# Patient Record
Sex: Female | Born: 1970 | Race: Black or African American | Hispanic: No | Marital: Single | Smoking: Never smoker
Health system: Southern US, Community
[De-identification: ages and names within clinical notes are randomized; demographics above are authoritative.]

## PROBLEM LIST (undated history)

## (undated) DIAGNOSIS — I10 Essential (primary) hypertension: Secondary | ICD-10-CM

## (undated) DIAGNOSIS — E119 Type 2 diabetes mellitus without complications: Secondary | ICD-10-CM

## (undated) HISTORY — PX: LEEP: SHX91

---

## 2006-12-11 ENCOUNTER — Ambulatory Visit (HOSPITAL_COMMUNITY): Admission: RE | Admit: 2006-12-11 | Discharge: 2006-12-11 | Payer: Self-pay | Admitting: Family Medicine

## 2017-01-27 ENCOUNTER — Emergency Department (HOSPITAL_COMMUNITY)
Admission: EM | Admit: 2017-01-27 | Discharge: 2017-01-27 | Disposition: A | Discharge status: Home / Self Care | Payer: BLUE CROSS/BLUE SHIELD | Attending: Emergency Medicine | Admitting: Emergency Medicine

## 2017-01-27 ENCOUNTER — Encounter: Payer: Self-pay | Admitting: Emergency Medicine

## 2017-01-27 DIAGNOSIS — I10 Essential (primary) hypertension: Secondary | ICD-10-CM

## 2017-01-27 LAB — CBC
HCT: 39.5 % (ref 36.0–46.0)
Hemoglobin: 13.4 g/dL (ref 12.0–15.0)
MCH: 29.5 pg (ref 26.0–34.0)
MCHC: 33.9 g/dL (ref 30.0–36.0)
MCV: 86.8 fL (ref 78.0–100.0)
Platelets: 484 10*3/uL — ABNORMAL HIGH (ref 150–400)
RBC: 4.55 MIL/uL (ref 3.87–5.11)
RDW: 13.3 % (ref 11.5–15.5)
WBC: 8.3 10*3/uL (ref 4.0–10.5)

## 2017-01-27 LAB — BASIC METABOLIC PANEL
Anion gap: 11 (ref 5–15)
BUN: 10 mg/dL (ref 6–20)
CO2: 23 mmol/L (ref 22–32)
Calcium: 9.7 mg/dL (ref 8.9–10.3)
Chloride: 99 mmol/L — ABNORMAL LOW (ref 101–111)
Creatinine, Ser: 0.78 mg/dL (ref 0.44–1.00)
GFR calc Af Amer: >60 mL/min (ref >60)
GFR calc non Af Amer: >60 mL/min (ref >60)
Glucose, Bld: 93 mg/dL (ref 65–99)
Potassium: 3.6 mmol/L (ref 3.5–5.1)
Sodium: 133 mmol/L — ABNORMAL LOW (ref 135–145)

## 2017-01-27 LAB — TROPONIN I: Troponin I: <0.03 ng/mL (ref <0.03)

## 2017-01-27 MED ORDER — CLONIDINE HCL 0.1 MG PO TABS
ORAL_TABLET | ORAL | 0 refills | Status: AC
Start: 2017-01-27 — End: ?

## 2017-01-27 NOTE — ED Notes (Signed)
Called to recheck Pt's vitals. No Answer x's 3.

## 2017-01-27 NOTE — ED Provider Notes (Signed)
[facility]ENC[facility] Provider Note   CSN: [id number] Arrival date & time: 01/27/17  1244     History   Chief Complaint Chief Complaint  Patient presents with  . Hypertension    HPI [name] York is a 46 y.o. female.  Pt presents to the ED today with HTN.  [facility].  Her BP was over 160 and she was told to come to the emergency room for evaluation.  She has a hx of htn and she usually is given a clonidine which takes care of it.  She said they did not want her bp to be elevated over the weekend.  The pt denies any cp or h/a.  Since she's been here, her bp has come down to normal.      No past medical history on file.   HTN DM There are no active problems to display for this patient.     OB History    No data available       Home Medications    See pt's list Prior to Admission medications   Medication Sig Start Date End Date Taking? Authorizing Provider  cloNIDine (CATAPRES) 0.1 MG tablet Take one pill per day if SBP over 160 01/27/17   Jacalyn LefevreHaviland, Mert Dietrick, MD    Family History No family history on file.  Social History Social History   Tobacco Use  . Smoking status: Not on file  Substance Use Topics  . Alcohol use: Not on file  . Drug use: Not on file  no smoking.  No drugs.   Allergies   Patient has no known allergies.   Review of Systems Review of Systems  All other systems reviewed and are negative.    Physical Exam Updated Vital Signs BP 129/78 (BP Location: Left Arm)   Pulse 64   Temp 98.3 F (36.8 C) (Oral)   Resp 16   Ht 5\' 7"  (1.702 m)   Wt 133.8 kg (295 lb)   LMP 01/23/2017   SpO2 98%   BMI 46.20 kg/m   Physical Exam  Constitutional: She is oriented to person, place, and time. She appears well-developed and well-nourished.  HENT:  Head: Normocephalic and atraumatic.  Right Ear: External ear normal.  Left Ear: External ear normal.  Nose: Nose  normal.  Mouth/Throat: Oropharynx is clear and moist.  Eyes: Conjunctivae and EOM are normal. Pupils are equal, round, and reactive to light.  Neck: Normal range of motion. Neck supple.  Cardiovascular: Normal rate, regular rhythm, normal heart sounds and intact distal pulses.  Pulmonary/Chest: Effort normal and breath sounds normal.  Abdominal: Soft. Bowel sounds are normal.  Musculoskeletal: Normal range of motion.  Neurological: She is alert and oriented to person, place, and time.  Skin: Skin is warm and dry. Capillary refill takes less than 2 seconds.  Psychiatric: She has a normal mood and affect. Her behavior is normal. Judgment and thought content normal.  Nursing note and vitals reviewed.    ED Treatments / Results  Labs (all labs ordered are listed, but only abnormal results are displayed) Labs Reviewed  BASIC METABOLIC PANEL - Abnormal; Notable for the following components:      Result Value   Sodium 133 (*)    Chloride 99 (*)    All other components within normal limits  CBC - Abnormal; Notable for the following components:   Platelets 484 (*)    All other components  within normal limits  TROPONIN I    EKG  EKG Interpretation None       Radiology No results found.  Procedures Procedures (including critical care time)  Medications Ordered in ED Medications - No data to display   Initial Impression / Assessment and Plan / ED Course  I have reviewed the triage vital signs and the nursing notes.  Pertinent labs & imaging results that were available during my care of the patient were reviewed by me and considered in my medical decision making (see chart for details).  EKG from [facility].  Pt is given rx of clonidine to take prn.  Return if worse.  F/u with pcp.  Final Clinical Impressions(s) / ED Diagnoses   Final diagnoses:  Essential hypertension    ED Discharge Orders        Ordered    cloNIDine (CATAPRES) 0.1 MG tablet     01/27/17 1754        Jacalyn LefevreHaviland, Addisyn Leclaire, MD 01/27/17 1757

## 2017-01-27 NOTE — ED Triage Notes (Signed)
Pt arrives to ED after seeing her PCP at a [facility] in [place] and was told her BP was in 160s and should come to ED. Pt denies any symptoms but was feeling dizzy prior to going to clinic this morning.

## 2019-07-12 ENCOUNTER — Encounter (HOSPITAL_COMMUNITY): Payer: Self-pay | Admitting: *Deleted

## 2019-07-12 ENCOUNTER — Emergency Department (HOSPITAL_COMMUNITY)
Admission: EM | Admit: 2019-07-12 | Discharge: 2019-07-12 | Disposition: A | Discharge status: Home / Self Care | Payer: No Typology Code available for payment source | Attending: Emergency Medicine | Admitting: Emergency Medicine

## 2019-07-12 ENCOUNTER — Other Ambulatory Visit: Payer: Self-pay

## 2019-07-12 ENCOUNTER — Emergency Department (HOSPITAL_COMMUNITY): Payer: No Typology Code available for payment source

## 2019-07-12 DIAGNOSIS — I10 Essential (primary) hypertension: Secondary | ICD-10-CM | POA: Insufficient documentation

## 2019-07-12 DIAGNOSIS — Z7984 Long term (current) use of oral hypoglycemic drugs: Secondary | ICD-10-CM | POA: Diagnosis not present

## 2019-07-12 DIAGNOSIS — E119 Type 2 diabetes mellitus without complications: Secondary | ICD-10-CM | POA: Insufficient documentation

## 2019-07-12 DIAGNOSIS — R1013 Epigastric pain: Secondary | ICD-10-CM | POA: Insufficient documentation

## 2019-07-12 HISTORY — DX: Type 2 diabetes mellitus without complications: E11.9

## 2019-07-12 HISTORY — DX: Essential (primary) hypertension: I10

## 2019-07-12 LAB — CBC
HCT: 40.5 % (ref 36.0–46.0)
Hemoglobin: 13.6 g/dL (ref 12.0–15.0)
MCH: 30.3 pg (ref 26.0–34.0)
MCHC: 33.6 g/dL (ref 30.0–36.0)
MCV: 90.2 fL (ref 80.0–100.0)
Platelets: 414 10*3/uL — ABNORMAL HIGH (ref 150–400)
RBC: 4.49 MIL/uL (ref 3.87–5.11)
RDW: 12.7 % (ref 11.5–15.5)
WBC: 7.6 10*3/uL (ref 4.0–10.5)
nRBC: 0 % (ref 0.0–0.2)

## 2019-07-12 LAB — COMPREHENSIVE METABOLIC PANEL
ALT: 18 U/L (ref 0–44)
AST: 17 U/L (ref 15–41)
Albumin: 4.6 g/dL (ref 3.5–5.0)
Alkaline Phosphatase: 66 U/L (ref 38–126)
Anion gap: 12 (ref 5–15)
BUN: 14 mg/dL (ref 6–20)
CO2: 26 mmol/L (ref 22–32)
Calcium: 9.7 mg/dL (ref 8.9–10.3)
Chloride: 97 mmol/L — ABNORMAL LOW (ref 98–111)
Creatinine, Ser: 0.85 mg/dL (ref 0.44–1.00)
GFR calc Af Amer: >60 mL/min (ref >60)
GFR calc non Af Amer: >60 mL/min (ref >60)
Glucose, Bld: 196 mg/dL — ABNORMAL HIGH (ref 70–99)
Potassium: 4 mmol/L (ref 3.5–5.1)
Sodium: 135 mmol/L (ref 135–145)
Total Bilirubin: 0.3 mg/dL (ref 0.3–1.2)
Total Protein: 8.4 g/dL — ABNORMAL HIGH (ref 6.5–8.1)

## 2019-07-12 LAB — URINALYSIS, ROUTINE W REFLEX MICROSCOPIC
Bacteria, UA: NONE SEEN
Bilirubin Urine: NEGATIVE
Glucose, UA: >=500 mg/dL — AB
Hgb urine dipstick: NEGATIVE
Ketones, ur: NEGATIVE mg/dL
Leukocytes,Ua: NEGATIVE
Nitrite: NEGATIVE
Protein, ur: NEGATIVE mg/dL
Specific Gravity, Urine: 1.027 (ref 1.005–1.030)
pH: 5 (ref 5.0–8.0)

## 2019-07-12 LAB — LIPASE, BLOOD: Lipase: 27 U/L (ref 11–51)

## 2019-07-12 LAB — POC URINE PREG, ED
Preg Test, Ur: NEGATIVE
Preg Test, Ur: NEGATIVE

## 2019-07-12 MED ORDER — PANTOPRAZOLE SODIUM 40 MG IV SOLR
40.0000 mg | Freq: Once | INTRAVENOUS | Status: AC
  Administered 2019-07-12: 40 mg via INTRAVENOUS
  Filled 2019-07-12: qty 40

## 2019-07-12 MED ORDER — IOHEXOL 300 MG/ML  SOLN
100.0000 mL | Freq: Once | INTRAMUSCULAR | Status: AC | PRN
  Administered 2019-07-12: 100 mL via INTRAVENOUS

## 2019-07-12 MED ORDER — PANTOPRAZOLE SODIUM 40 MG PO TBEC: 40.0000 mg | DELAYED_RELEASE_TABLET | Freq: Every day | ORAL | 0 refills | Status: AC

## 2019-07-12 NOTE — ED Notes (Signed)
ED Provider at bedside. 

## 2019-07-12 NOTE — Discharge Instructions (Addendum)
You are scheduled to have an ultrasound on your abdomen tomorrow morning at 9 AM.  Please arrive 15 minutes early to register.  I have prescribed Protonix to take daily.  Continue bland diet.  Call the GI provider listed to arrange a follow-up appointment.  Return to the emergency department for any worsening symptoms such as fever greater than 101.5, persistent vomiting.

## 2019-07-12 NOTE — ED Triage Notes (Signed)
Abdominal pain intermittent for several weeks

## 2019-07-12 NOTE — ED Provider Notes (Signed)
[facility] EMERGENCY DEPARTMENT Provider Note   CSN: [id number] Arrival date & time: 07/12/19  1630     History Chief Complaint  Patient presents with  . Abdominal Pain    [name] York is a 49 y.o. female.  HPI      [name] York is a 49 y.o. female who presents to the Emergency Department complaining of waxing and waning upper abdominal pain for 2 weeks.  She reports bouts of severe pain along her epigastric area that wanes to a dull pain but does not resolve.  Symptoms are also associated with some upper back pain.  Pain seems to worsen with food intake.  She also reports decreased appetite.  No nausea or vomiting.  No diarrhea, chest pain or shortness of breath.  She denies fever or chills.  No history of abdominal surgeries.  She has tried antacids without relief.   Past Medical History:  Diagnosis Date  . Diabetes mellitus without complication (HCC)   . Hypertension     There are no problems to display for this patient.   History reviewed. No pertinent surgical history.   OB History   No obstetric history on file.     No family history on file.  Social History   Tobacco Use  . Smoking status: Never Smoker  . Smokeless tobacco: Never Used  Vaping Use  . Vaping Use: Never used  Substance Use Topics  . Alcohol use: Never  . Drug use: Never    Home Medications Prior to Admission medications   Medication Sig Start Date End Date Taking? Authorizing Provider  cloNIDine (CATAPRES) 0.1 MG tablet Take one pill per day if SBP over 160 01/27/17   Jacalyn Lefevre, MD    Allergies    Patient has no known allergies.  Review of Systems   Review of Systems  Constitutional: Negative for appetite change, chills and fever.  Respiratory: Negative for shortness of breath.   Cardiovascular: Negative for chest pain.  Gastrointestinal: Positive for abdominal pain. Negative for abdominal distention, blood in stool, diarrhea, nausea and vomiting.   Genitourinary: Negative for decreased urine volume, difficulty urinating, dysuria and flank pain.  Musculoskeletal: Negative for back pain (Upper mid to right back pain).  Skin: Negative for color change and rash.  Neurological: Negative for dizziness, weakness and numbness.  Hematological: Negative for adenopathy.    Physical Exam Updated Vital Signs BP (!) 157/88   Pulse 79   Temp 98.6 F (37 C)   Resp 20   LMP 06/23/2019   SpO2 99%   Physical Exam Vitals and nursing note reviewed.  Constitutional:      General: She is not in acute distress.    Appearance: Normal appearance. She is well-developed.  HENT:     Mouth/Throat:     Mouth: Mucous membranes are moist.  Cardiovascular:     Rate and Rhythm: Normal rate and regular rhythm.     Pulses: Normal pulses.  Pulmonary:     Effort: Pulmonary effort is normal.     Breath sounds: Normal breath sounds.  Abdominal:     General: There is no distension.     Palpations: Abdomen is soft.     Tenderness: There is abdominal tenderness. There is no right CVA tenderness, left CVA tenderness or guarding.     Comments: Tenderness palpation of the epigastric area.  No guarding or rebound.  Abdomen is soft.  Musculoskeletal:        General: Normal range of motion.  Skin:    General: Skin is warm.     Capillary Refill: Capillary refill takes less than 2 seconds.     Findings: No rash.  Neurological:     General: No focal deficit present.     Mental Status: She is alert.     ED Results / Procedures / Treatments   Labs (all labs ordered are listed, but only abnormal results are displayed) Labs Reviewed  CBC - Abnormal; Notable for the following components:      Result Value   Platelets 414 (*)    All other components within normal limits  LIPASE, BLOOD  COMPREHENSIVE METABOLIC PANEL  URINALYSIS, ROUTINE W REFLEX MICROSCOPIC  POC URINE PREG, ED    EKG None  Radiology No results found.  Procedures Procedures  (including critical care time)  Medications Ordered in ED Medications  pantoprazole (PROTONIX) injection 40 mg (has no administration in time range)    ED Course  I have reviewed the triage vital signs and the nursing notes.  Pertinent labs & imaging results that were available during my care of the patient were reviewed by me and considered in my medical decision making (see chart for details).   MDM Rules/Calculators/A&P                          Patient here with waxing and waning 2-week history of upper abdominal pain with pain worsening upon food intake.  On exam she is well-appearing, nontoxic.  She does have some mild to moderate right upper quadrant and epigastric pain.  No peritoneal signs.  Exam concerning for gallbladder disease.  Ultrasound not available after hours, will proceed with laboratory studies and CT of the abdomen and pelvis.   CT A/P shows duodenitis with possible duodenal ulcer.  Appendix unremarkable and gallbladder is decompressed.  I will start pt on PPI and schedule her to return here for out patient [place] of RUQ for Saturday morning.  Pt agrees to plan.  F/u info given for GI as well.    Final Clinical Impression(s) / ED Diagnoses Final diagnoses:  Epigastric pain    Rx / DC Orders ED Discharge Orders    None       Kem Parkinson, PA-C 07/14/19 1344    Fredia Sorrow, MD 07/22/19 1701

## 2019-07-13 ENCOUNTER — Ambulatory Visit (HOSPITAL_COMMUNITY)
Admission: RE | Admit: 2019-07-13 | Discharge: 2019-07-13 | Disposition: A | Discharge status: Home / Self Care | Payer: No Typology Code available for payment source | Source: Ambulatory Visit | Attending: Emergency Medicine | Admitting: Emergency Medicine

## 2019-07-13 DIAGNOSIS — R1013 Epigastric pain: Secondary | ICD-10-CM | POA: Insufficient documentation

## 2019-07-13 NOTE — ED Provider Notes (Signed)
The patient return for her ultrasound this morning, it is negative for acute findings, she was reassured that her CT scan showed some duodenal bulb inflammation and that she should continue with her proton pump inhibitor, also recommended Pepcid, patient agreeable, stable for discharge   Eber Hong, MD 07/13/19 1054

## 2020-02-15 ENCOUNTER — Other Ambulatory Visit: Payer: No Typology Code available for payment source

## 2020-02-15 DIAGNOSIS — Z20822 Contact with and (suspected) exposure to covid-19: Secondary | ICD-10-CM

## 2020-02-18 LAB — NOVEL CORONAVIRUS, NAA: SARS-CoV-2, NAA: DETECTED — AB

## 2021-02-16 ENCOUNTER — Emergency Department (HOSPITAL_COMMUNITY): Payer: No Typology Code available for payment source

## 2021-02-16 ENCOUNTER — Other Ambulatory Visit: Payer: Self-pay

## 2021-02-16 ENCOUNTER — Emergency Department (HOSPITAL_COMMUNITY)
Admission: EM | Admit: 2021-02-16 | Discharge: 2021-02-16 | Disposition: A | Discharge status: Home / Self Care | Payer: No Typology Code available for payment source | Attending: Emergency Medicine | Admitting: Emergency Medicine

## 2021-02-16 ENCOUNTER — Encounter (HOSPITAL_COMMUNITY): Payer: Self-pay

## 2021-02-16 DIAGNOSIS — M5030 Other cervical disc degeneration, unspecified cervical region: Secondary | ICD-10-CM | POA: Insufficient documentation

## 2021-02-16 DIAGNOSIS — R739 Hyperglycemia, unspecified: Secondary | ICD-10-CM

## 2021-02-16 DIAGNOSIS — E1165 Type 2 diabetes mellitus with hyperglycemia: Secondary | ICD-10-CM | POA: Insufficient documentation

## 2021-02-16 DIAGNOSIS — Z79899 Other long term (current) drug therapy: Secondary | ICD-10-CM | POA: Insufficient documentation

## 2021-02-16 DIAGNOSIS — I10 Essential (primary) hypertension: Secondary | ICD-10-CM | POA: Insufficient documentation

## 2021-02-16 DIAGNOSIS — R519 Headache, unspecified: Secondary | ICD-10-CM | POA: Diagnosis present

## 2021-02-16 DIAGNOSIS — Z7984 Long term (current) use of oral hypoglycemic drugs: Secondary | ICD-10-CM | POA: Diagnosis not present

## 2021-02-16 LAB — CBC WITH DIFFERENTIAL/PLATELET
Abs Immature Granulocytes: 0.02 10*3/uL (ref 0.00–0.07)
Basophils Absolute: 0.1 10*3/uL (ref 0.0–0.1)
Basophils Relative: 1 %
Eosinophils Absolute: 0.3 10*3/uL (ref 0.0–0.5)
Eosinophils Relative: 3 %
HCT: 42.3 % (ref 36.0–46.0)
Hemoglobin: 15 g/dL (ref 12.0–15.0)
Immature Granulocytes: 0 %
Lymphocytes Relative: 44 %
Lymphs Abs: 3.2 10*3/uL (ref 0.7–4.0)
MCH: 32.4 pg (ref 26.0–34.0)
MCHC: 35.5 g/dL (ref 30.0–36.0)
MCV: 91.4 fL (ref 80.0–100.0)
Monocytes Absolute: 0.5 10*3/uL (ref 0.1–1.0)
Monocytes Relative: 6 %
Neutro Abs: 3.4 10*3/uL (ref 1.7–7.7)
Neutrophils Relative %: 46 %
Platelets: 363 10*3/uL (ref 150–400)
RBC: 4.63 MIL/uL (ref 3.87–5.11)
RDW: 12.4 % (ref 11.5–15.5)
WBC: 7.4 10*3/uL (ref 4.0–10.5)
nRBC: 0 % (ref 0.0–0.2)

## 2021-02-16 LAB — BASIC METABOLIC PANEL
Anion gap: 12 (ref 5–15)
BUN: 12 mg/dL (ref 6–20)
CO2: 23 mmol/L (ref 22–32)
Calcium: 9.3 mg/dL (ref 8.9–10.3)
Chloride: 98 mmol/L (ref 98–111)
Creatinine, Ser: 0.68 mg/dL (ref 0.44–1.00)
GFR, Estimated: >60 mL/min (ref >60)
Glucose, Bld: 368 mg/dL — ABNORMAL HIGH (ref 70–99)
Potassium: 4.1 mmol/L (ref 3.5–5.1)
Sodium: 133 mmol/L — ABNORMAL LOW (ref 135–145)

## 2021-02-16 LAB — CBG MONITORING, ED: Glucose-Capillary: 301 mg/dL — ABNORMAL HIGH (ref 70–99)

## 2021-02-16 LAB — HCG, QUANTITATIVE, PREGNANCY: hCG, Beta Chain, Quant, S: <1 m[IU]/mL (ref <5)

## 2021-02-16 MED ORDER — METOPROLOL SUCCINATE ER 50 MG PO TB24
100.0000 mg | ORAL_TABLET | Freq: Every day | ORAL | Status: DC
  Administered 2021-02-16: 100 mg via ORAL
  Filled 2021-02-16: qty 2

## 2021-02-16 MED ORDER — DIPHENHYDRAMINE HCL 50 MG/ML IJ SOLN
12.5000 mg | Freq: Once | INTRAMUSCULAR | Status: AC
  Administered 2021-02-16: 12.5 mg via INTRAVENOUS
  Filled 2021-02-16: qty 1

## 2021-02-16 MED ORDER — LOSARTAN POTASSIUM 25 MG PO TABS
100.0000 mg | ORAL_TABLET | Freq: Once | ORAL | Status: AC
Start: 2021-02-16 — End: 2021-02-16
  Administered 2021-02-16: 100 mg via ORAL
  Filled 2021-02-16: qty 4

## 2021-02-16 MED ORDER — SODIUM CHLORIDE 0.9 % IV BOLUS
1000.0000 mL | Freq: Once | INTRAVENOUS | Status: AC
  Administered 2021-02-16: 1000 mL via INTRAVENOUS

## 2021-02-16 MED ORDER — AMLODIPINE BESYLATE 5 MG PO TABS
10.0000 mg | ORAL_TABLET | Freq: Once | ORAL | Status: AC
  Administered 2021-02-16: 10 mg via ORAL
  Filled 2021-02-16: qty 2

## 2021-02-16 MED ORDER — ACETAMINOPHEN 500 MG PO TABS
1000.0000 mg | ORAL_TABLET | Freq: Once | ORAL | Status: AC
  Administered 2021-02-16: 1000 mg via ORAL
  Filled 2021-02-16: qty 2

## 2021-02-16 MED ORDER — GADOBUTROL 1 MMOL/ML IV SOLN
10.0000 mL | Freq: Once | INTRAVENOUS | Status: AC | PRN
  Administered 2021-02-16: 10 mL via INTRAVENOUS

## 2021-02-16 MED ORDER — INSULIN ASPART 100 UNIT/ML IJ SOLN
8.0000 [IU] | Freq: Once | INTRAMUSCULAR | Status: AC
  Administered 2021-02-16: 8 [IU] via SUBCUTANEOUS
  Filled 2021-02-16: qty 1

## 2021-02-16 MED ORDER — PROCHLORPERAZINE EDISYLATE 10 MG/2ML IJ SOLN
10.0000 mg | Freq: Once | INTRAMUSCULAR | Status: AC
Start: 2021-02-16 — End: 2021-02-16
  Administered 2021-02-16: 10 mg via INTRAVENOUS
  Filled 2021-02-16: qty 2

## 2021-02-16 MED ORDER — METOPROLOL TARTRATE 5 MG/5ML IV SOLN: 5.0000 mg | Freq: Once | INTRAVENOUS | Status: DC

## 2021-02-16 NOTE — ED Provider Notes (Signed)
°  Face-to-face evaluation   History: She presents for evaluation of headache for several days, followed by numbness in right forearm and hand, which started today.  She did not take her blood pressure medications prior to arrival today.  She does not have chronic headaches, neck pain, dizziness, numbness or weakness.  Physical exam: Alert middle-aged female who is overweight.  No dysarthria or aphasia.  No facial asymmetry.  Normal motion arms and legs bilaterally  MDM: Evaluation for  Chief Complaint  Patient presents with   Headache     Unusual constellation of headache, and right forearm and hand numbness, without chronic neck pain.  Differential diagnosis includes multiple sclerosis, CVA, and metabolic disorders.  Initial head CT is negative so MRI imaging of the brain and cervical spine have been ordered.  Medical screening examination/treatment/procedure(s) were conducted as a shared visit with non-physician practitioner(s) and myself.  I personally evaluated the patient during the encounter    Mancel Bale, MD 02/16/21 1956

## 2021-02-16 NOTE — Discharge Instructions (Addendum)
Make sure you are taking your blood pressure medications as prescribed to avoid further problems with high blood pressure.  Also, your blood glucose level is elevated today, it is improved currently but still needs some work, take your home medications and watch your diet closely.  As discussed if you develop any weakness (not tingling) in your arm or hand you need to emergently get rechecked for this worsening symptom.

## 2021-02-16 NOTE — ED Provider Notes (Signed)
[facility] EMERGENCY DEPARTMENT Provider Note   CSN: [id number] Arrival date & time: 02/16/21  0744     History  Chief Complaint  Patient presents with   Headache    [name] York is a 51 y.o. female with history significant for hypertension and diabetes presenting for evaluation of a headache which is been present for the past 4 days, stating it started out predominantly left-sided behind her left eye but has now spread and includes the entire head.  In association she reports tingling sensation in her right forearm which she woke with this morning.  She has been compliant with her blood pressure medicines which include metoprolol, amlodipine and spironolactone however she did not take her blood pressure medicine this morning.  She denies nausea or vomiting, denies focal weakness, dizziness, neck pain or stiffness.  She does endorse mild blurred vision currently.  She wears glasses and is currently wearing them.  She also denies chest pain or shortness of breath.  She has been compliant with her diabetes medications, but states she received a Decadron injection last week secondary to suspected insect bites which have been itchy, hence elevation in her blood glucose levels.  She has found no alleviators for her headache pain.  The history is provided by the patient.      Home Medications Prior to Admission medications   Medication Sig Start Date End Date Taking? Authorizing Provider  amLODipine (NORVASC) 10 MG tablet Take 1 tablet by mouth daily. 02/06/18  Yes [provider]  DULoxetine (CYMBALTA) 30 MG capsule Take 30 mg by mouth 2 (two) times daily.   Yes [provider]  gabapentin (NEURONTIN) 100 MG capsule Take 100-200 mg by mouth 2 (two) times daily. 170m in the morning and 2059mat night.   Yes [provider]  losartan (COZAAR) 100 MG tablet Take 1 tablet by mouth daily. 03/29/17  Yes [provider]  metFORMIN (GLUCOPHAGE) 1000 MG tablet  Take 1 tablet by mouth 2 (two) times daily.   Yes [provider]  metoprolol succinate (TOPROL-XL) 100 MG 24 hr tablet Take 100 mg by mouth 2 (two) times daily. 02/06/18  Yes [provider]  spironolactone (ALDACTONE) 25 MG tablet Take 1 tablet by mouth daily.   Yes [provider]  cloNIDine (CATAPRES) 0.1 MG tablet Take one pill per day if SBP over 160 Patient not taking: Reported on 02/16/2021 01/27/17   HaIsla PenceMD  pantoprazole (PROTONIX) 40 MG tablet Take 1 tablet (40 mg total) by mouth daily. Patient not taking: Reported on 02/16/2021 07/12/19   TrKem ParkinsonPA-C      Allergies    Sulfa antibiotics    Review of Systems   Review of Systems  Constitutional:  Negative for chills and fever.  HENT:  Negative for congestion and sore throat.   Eyes:  Positive for visual disturbance. Negative for photophobia.  Respiratory:  Negative for chest tightness and shortness of breath.   Cardiovascular:  Negative for chest pain.  Gastrointestinal:  Negative for abdominal pain, nausea and vomiting.  Genitourinary: Negative.   Musculoskeletal:  Negative for arthralgias, joint swelling, neck pain and neck stiffness.  Skin: Negative.  Negative for rash and wound.  Neurological:  Positive for numbness and headaches. Negative for dizziness, weakness and light-headedness.  Psychiatric/Behavioral: Negative.     Physical Exam Updated Vital Signs BP 129/79 (BP Location: Right Arm)    Pulse 78    Temp 98.7 F (37.1 C) (Oral)  Resp 16    Ht _0  (1.702 m)    Wt 128.4 kg    LMP 01/17/2021 (Approximate)    SpO2 98%    BMI 44.32 kg/m  Physical Exam Vitals and nursing note reviewed.  Constitutional:      Appearance: She is well-developed.  HENT:     Head: Normocephalic and atraumatic.     Right Ear: Tympanic membrane normal.     Left Ear: Tympanic membrane normal.  Eyes:     Extraocular Movements: Extraocular movements intact.     Conjunctiva/sclera: Conjunctivae  normal.     Pupils: Pupils are equal, round, and reactive to light.  Cardiovascular:     Rate and Rhythm: Normal rate and regular rhythm.     Pulses:          Radial pulses are 2+ on the right side and 2+ on the left side.     Heart sounds: Normal heart sounds.  Pulmonary:     Effort: Pulmonary effort is normal.     Breath sounds: Normal breath sounds. No wheezing.  Abdominal:     General: Bowel sounds are normal.     Palpations: Abdomen is soft.     Tenderness: There is no abdominal tenderness.  Musculoskeletal:        General: Normal range of motion.     Cervical back: Normal range of motion and neck supple.  Lymphadenopathy:     Cervical: No cervical adenopathy.  Skin:    General: Skin is warm and dry.     Findings: No rash.  Neurological:     General: No focal deficit present.     Mental Status: She is alert and oriented to person, place, and time.     GCS: GCS eye subscore is 4. GCS verbal subscore is 5. GCS motor subscore is 6.     Cranial Nerves: No cranial nerve deficit, dysarthria or facial asymmetry.     Sensory: Sensory deficit present.     Coordination: Coordination normal.     Gait: Gait normal.     Deep Tendon Reflexes: Reflexes normal.     Comments: Normal heel-shin, normal rapid alternating movements. Cranial nerves III-XII intact.  No pronator drift.  Decreased sensation to fine touch right dorsal forearm.  Sensation in hand and fingertips is normal, no vulvar involvement.  Psychiatric:        Speech: Speech normal.        Behavior: Behavior normal.        Thought Content: Thought content normal.    ED Results / Procedures / Treatments   Labs (all labs ordered are listed, but only abnormal results are displayed) Labs Reviewed  BASIC METABOLIC PANEL - Abnormal; Notable for the following components:      Result Value   Sodium 133 (*)    Glucose, Bld 368 (*)    All other components within normal limits  CBG MONITORING, ED - Abnormal; Notable for the  following components:   Glucose-Capillary 301 (*)    All other components within normal limits  CBC WITH DIFFERENTIAL/PLATELET  HCG, QUANTITATIVE, PREGNANCY    EKG EKG Interpretation  Date/Time:  Tuesday February 16 2021 10:33:55 EST Ventricular Rate:  79 PR Interval:  146 QRS Duration: 80 QT Interval:  416 QTC Calculation: 477 R Axis:   30 Text Interpretation: Sinus rhythm with Premature atrial complexes with Abberant conduction Otherwise normal ECG No previous ECGs available Confirmed by Daleen Bo [phone]) on 02/16/2021 11:24:47 AM  Radiology CT  Head Wo Contrast  Result Date: 02/16/2021 CLINICAL DATA:  Patient c/o headache with n/v todayHeadache, new or worsening (Age >= 50y) headache with hypertension, right hand tingling EXAM: CT HEAD WITHOUT CONTRAST TECHNIQUE: Contiguous axial images were obtained from the base of the skull through the vertex without intravenous contrast. RADIATION DOSE REDUCTION: This exam was performed according to the departmental dose-optimization program which includes automated exposure control, adjustment of the mA and/or kV according to patient size and/or use of iterative reconstruction technique. COMPARISON:  None. FINDINGS: Brain: No acute intracranial hemorrhage. No focal mass lesion. No CT evidence of acute infarction. No midline shift or mass effect. No hydrocephalus. Basilar cisterns are patent. Vascular: No hyperdense vessel or unexpected calcification. Skull: Normal. Negative for fracture or focal lesion. Sinuses/Orbits: Paranasal sinuses and mastoid air cells are clear. Orbits are clear. Other: None. IMPRESSION: Normal head CT. Electronically Signed   By: Suzy Bouchard M.D.   On: 02/16/2021 09:48   MR Brain W and Wo Contrast  Result Date: 02/16/2021 CLINICAL DATA:  Neuro deficit, acute, stroke suspected EXAM: MRI HEAD WITHOUT AND WITH CONTRAST TECHNIQUE: Multiplanar, multiecho pulse sequences of the brain and surrounding structures were obtained  without and with intravenous contrast. CONTRAST:  74mL GADAVIST GADOBUTROL 1 MMOL/ML IV SOLN COMPARISON:  None. FINDINGS: Brain: There is no acute infarction or intracranial hemorrhage. There is no intracranial mass, mass effect, or edema. There is no hydrocephalus or extra-axial fluid collection. Patchy foci of T2 hyperintensity in the supratentorial and pontine white matter nonspecific but may reflect mild chronic microvascular ischemic changes. Ventricles and sulci are normal in size and configuration. 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 Vascular: Major vessel flow voids at the skull base are preserved. Skull and upper cervical spine: Normal marrow signal is preserved. Sinuses/Orbits: Paranasal sinuses are aerated. Orbits are unremarkable. Other: Sella is unremarkable.  Mastoid air cells are clear. IMPRESSION: No acute infarction, hemorrhage, or mass. Mild chronic microvascular ischemic changes. Electronically Signed   By: Macy Mis M.D.   On: 02/16/2021 15:30   MR Cervical Spine W or Wo Contrast  Result Date: 02/16/2021 CLINICAL DATA:  Cervical radiculopathy EXAM: MRI CERVICAL SPINE WITHOUT AND WITH CONTRAST TECHNIQUE: Multiplanar and multiecho pulse sequences of the cervical spine, to include the craniocervical junction and cervicothoracic junction, were obtained without and with intravenous contrast. CONTRAST:  50mL GADAVIST GADOBUTROL 1 MMOL/ML IV SOLN COMPARISON:  None. FINDINGS: Alignment: Straightening of the cervical spine with mild reversal of the normal lordosis. Vertebrae: No fracture, evidence of discitis, or bone lesion. Cord: Normal signal and morphology. Posterior Fossa, vertebral arteries, paraspinal tissues: Negative. Disc levels: C2-3: No significant disc bulge. No neural foraminal stenosis. No central canal stenosis. C3-4: No significant disc bulge. No neural foraminal stenosis. No central canal stenosis. C4-5: No significant disc bulge. No neural foraminal  stenosis. No central canal stenosis. C5-6: Circumferential disc protrusion with flattening of the thecal sac. Mild bilateral neural foraminal narrowing, right worse than the left. C6-7: No significant disc bulge. No neural foraminal stenosis. No central canal stenosis. C7-T1: No significant disc bulge. No neural foraminal stenosis. No central canal stenosis. IMPRESSION: 1.  No acute fracture or subluxation. 2.  Visualized cord is normal in signal and morphology. 3. Degenerate disc disease prominent at C5-C6 with bilateral neural foraminal narrowing, right worse than the left. Electronically Signed   By: Keane Police D.O.   On: 02/16/2021 15:40    Procedures Procedures    Medications Ordered in ED Medications  metoprolol  succinate (TOPROL-XL) 24 hr tablet 100 mg (100 mg Oral Given 02/16/21 1134)  sodium chloride 0.9 % bolus 1,000 mL (1,000 mLs Intravenous New Bag/Given 02/16/21 0949)  insulin aspart (novoLOG) injection 8 Units (8 Units Subcutaneous Given 02/16/21 0949)  acetaminophen (TYLENOL) tablet 1,000 mg (1,000 mg Oral Given 02/16/21 0958)  losartan (COZAAR) tablet 100 mg (100 mg Oral Given 02/16/21 1133)  amLODipine (NORVASC) tablet 10 mg (10 mg Oral Given 02/16/21 1133)  prochlorperazine (COMPAZINE) injection 10 mg (10 mg Intravenous Given 02/16/21 1240)  diphenhydrAMINE (BENADRYL) injection 12.5 mg (12.5 mg Intravenous Given 02/16/21 1240)  gadobutrol (GADAVIST) 1 MMOL/ML injection 10 mL (10 mLs Intravenous Contrast Given 02/16/21 1432)    ED Course/ Medical Decision Making/ A&P                           Medical Decision Making Patient presenting with a left-sided headache which has been present x4 days, today developed numbness in her right forearm, presenting with hypertension and medication noncompliance.  Initially her CBG is very elevated, she has recently received a Decadron injection for an improving rash.  Differential diagnosis including acute CVA, ischemic versus hemorrhagic,  hypertensive emergency/urgency, metabolic etiology, MS, brain mass.   Amount and/or Complexity of Data Reviewed Labs: ordered.    Details: Labs including be met, hCG, CBC obtained.  Initially she had a blood glucose level at 368.  No DKA.  Other electrolytes are relatively normal, mild hyponatremia at 133 which is negligible. Radiology: ordered.    Details: CT imaging initially obtained to rule out acute hemorrhagic stroke, this was a normal study.  This was followed by an MRI of her head and C-spine to rule out ischemic findings, mets, mass or lesions suggesting MS.  This was reviewed and also negative for acute disease. ECG/medicine tests: ordered. Decision-making details documented in ED Course.  Risk OTC drugs.   Patient is stable for discharge home.  She was advised to follow-up with her PCP who is with the [facility] in [place].  She states she has an appointment with this provider on February 2.  She was advised to follow-up sooner for any return or worsening symptoms.  We also discussed referral to neurosurgery given her degenerative disc disease found on today's MR C-spine.  She will discuss this with her provider, however she may end up deciding to see a private neurosurgeon.  She was given a referral, Dr. Trenton Gammon if she chooses to go this route.  She has no emergent symptoms today suggesting need for surgical intervention at this time, however we did discuss strict return precautions.  Also advised close watch on her blood glucose levels over the next several days, make good food choices.  She is not chronically on steroids but did receive a Decadron injection 4 days ago.        Final Clinical Impression(s) / ED Diagnoses Final diagnoses:  Acute nonintractable headache, unspecified headache type  Primary hypertension  Hyperglycemia  DDD (degenerative disc disease), cervical    Rx / DC Orders ED Discharge Orders     None         Landis Martins 02/16/21 1718    Daleen Bo, MD 02/16/21 [phone]

## 2021-02-16 NOTE — ED Triage Notes (Signed)
Patient complaining of increased hypertension with headache and right hand tingling. The headache started on Saturday, has not taken blood pressure medication today. Right hand tingling was present when she woke up this morning.

## 2022-01-26 ENCOUNTER — Emergency Department (HOSPITAL_COMMUNITY): Payer: No Typology Code available for payment source

## 2022-01-26 ENCOUNTER — Other Ambulatory Visit: Payer: Self-pay

## 2022-01-26 ENCOUNTER — Emergency Department (HOSPITAL_COMMUNITY)
Admission: EM | Admit: 2022-01-26 | Discharge: 2022-01-26 | Disposition: A | Discharge status: Home / Self Care | Payer: No Typology Code available for payment source | Attending: Emergency Medicine | Admitting: Emergency Medicine

## 2022-01-26 ENCOUNTER — Encounter (HOSPITAL_COMMUNITY): Payer: Self-pay | Admitting: *Deleted

## 2022-01-26 DIAGNOSIS — D72829 Elevated white blood cell count, unspecified: Secondary | ICD-10-CM | POA: Insufficient documentation

## 2022-01-26 DIAGNOSIS — N3001 Acute cystitis with hematuria: Secondary | ICD-10-CM | POA: Insufficient documentation

## 2022-01-26 DIAGNOSIS — R109 Unspecified abdominal pain: Secondary | ICD-10-CM | POA: Diagnosis present

## 2022-01-26 DIAGNOSIS — R7309 Other abnormal glucose: Secondary | ICD-10-CM | POA: Diagnosis not present

## 2022-01-26 LAB — LIPASE, BLOOD: Lipase: 31 U/L (ref 11–51)

## 2022-01-26 LAB — URINALYSIS, ROUTINE W REFLEX MICROSCOPIC
Bilirubin Urine: NEGATIVE
Glucose, UA: >=500 mg/dL — AB
Ketones, ur: NEGATIVE mg/dL
Nitrite: POSITIVE — AB
Protein, ur: >=300 mg/dL — AB
RBC / HPF: >50 RBC/hpf — ABNORMAL HIGH (ref 0–5)
Specific Gravity, Urine: 1.017 (ref 1.005–1.030)
WBC, UA: >50 WBC/hpf — ABNORMAL HIGH (ref 0–5)
pH: 5 (ref 5.0–8.0)

## 2022-01-26 LAB — CBG MONITORING, ED: Glucose-Capillary: 178 mg/dL — ABNORMAL HIGH (ref 70–99)

## 2022-01-26 LAB — COMPREHENSIVE METABOLIC PANEL
ALT: 22 U/L (ref 0–44)
AST: 17 U/L (ref 15–41)
Albumin: 4.2 g/dL (ref 3.5–5.0)
Alkaline Phosphatase: 68 U/L (ref 38–126)
Anion gap: 10 (ref 5–15)
BUN: 15 mg/dL (ref 6–20)
CO2: 26 mmol/L (ref 22–32)
Calcium: 10.1 mg/dL (ref 8.9–10.3)
Chloride: 100 mmol/L (ref 98–111)
Creatinine, Ser: 0.88 mg/dL (ref 0.44–1.00)
GFR, Estimated: >60 mL/min (ref >60)
Glucose, Bld: 185 mg/dL — ABNORMAL HIGH (ref 70–99)
Potassium: 4.8 mmol/L (ref 3.5–5.1)
Sodium: 136 mmol/L (ref 135–145)
Total Bilirubin: 0.9 mg/dL (ref 0.3–1.2)
Total Protein: 7.8 g/dL (ref 6.5–8.1)

## 2022-01-26 LAB — CBC
HCT: 38.7 % (ref 36.0–46.0)
Hemoglobin: 13.3 g/dL (ref 12.0–15.0)
MCH: 31.1 pg (ref 26.0–34.0)
MCHC: 34.4 g/dL (ref 30.0–36.0)
MCV: 90.4 fL (ref 80.0–100.0)
Platelets: 427 10*3/uL — ABNORMAL HIGH (ref 150–400)
RBC: 4.28 MIL/uL (ref 3.87–5.11)
RDW: 12.3 % (ref 11.5–15.5)
WBC: 12.6 10*3/uL — ABNORMAL HIGH (ref 4.0–10.5)
nRBC: 0 % (ref 0.0–0.2)

## 2022-01-26 LAB — PREGNANCY, URINE: Preg Test, Ur: NEGATIVE

## 2022-01-26 MED ORDER — PHENAZOPYRIDINE HCL 95 MG PO TABS: 95.0000 mg | ORAL_TABLET | Freq: Three times a day (TID) | ORAL | 0 refills | Status: AC | PRN

## 2022-01-26 MED ORDER — NAPROXEN 500 MG PO TABS: 500.0000 mg | ORAL_TABLET | Freq: Two times a day (BID) | ORAL | 0 refills | Status: AC

## 2022-01-26 MED ORDER — ONDANSETRON HCL 4 MG PO TABS: 4.0000 mg | ORAL_TABLET | Freq: Four times a day (QID) | ORAL | 0 refills | Status: AC

## 2022-01-26 MED ORDER — CEPHALEXIN 500 MG PO CAPS: 500.0000 mg | ORAL_CAPSULE | Freq: Two times a day (BID) | ORAL | 0 refills | Status: AC

## 2022-01-26 MED ORDER — IOHEXOL 300 MG/ML  SOLN
100.0000 mL | Freq: Once | INTRAMUSCULAR | Status: AC | PRN
  Administered 2022-01-26: 100 mL via INTRAVENOUS

## 2022-01-26 MED ORDER — HYDROCODONE-ACETAMINOPHEN 5-325 MG PO TABS
1.0000 | ORAL_TABLET | Freq: Once | ORAL | Status: AC
  Administered 2022-01-26: 1 via ORAL
  Filled 2022-01-26: qty 1

## 2022-01-26 MED ORDER — KETOROLAC TROMETHAMINE 15 MG/ML IJ SOLN
15.0000 mg | Freq: Once | INTRAMUSCULAR | Status: AC
Start: 2022-01-26 — End: 2022-01-26
  Administered 2022-01-26: 15 mg via INTRAVENOUS
  Filled 2022-01-26: qty 1

## 2022-01-26 MED ORDER — ONDANSETRON 4 MG PO TBDP
4.0000 mg | ORAL_TABLET | Freq: Once | ORAL | Status: AC
  Administered 2022-01-26: 4 mg via ORAL
  Filled 2022-01-26: qty 1

## 2022-01-26 NOTE — ED Triage Notes (Signed)
Pt c/o right flank pain that radiates around to her abdomen;  Pt having episodes of diaphoresis and chills  Pt denies any urinary sx  Pt having nausea

## 2022-01-26 NOTE — Discharge Instructions (Signed)
It was a pleasure taking care of you today!   Your labs show slightly elevated white blood cell count however were otherwise without emergent findings.  Your CT scan did not show any emergent findings today. Your urine was notable for a UTI. You will be sent a prescription for Keflex, take as prescribed. Ensure that you complete the entire course of antibiotic.  You will also be sent a prescription for Naprosyn, Zofran, Pyridium.  Your urine will turn neon orange/yellow with the Pyridium.  Ensure to maintain fluid intake.  You may follow-up with your primary care provider as needed.  Return to the emergency department if you are experiencing increasing/worsening symptoms.

## 2022-01-26 NOTE — ED Notes (Signed)
Pt taken to ct 

## 2022-01-26 NOTE — ED Notes (Signed)
Patient transported to CT 

## 2022-01-26 NOTE — ED Provider Notes (Signed)
[facility] EMERGENCY DEPARTMENT Provider Note   CSN: [id number] Arrival date & time: 01/26/22  5597     History  Chief Complaint  Patient presents with   [name] York    EMERLYN MEHLHOFF is a 51 y.o. female who presents emergency department with concerns for right-sided [name] York.  Notes that her York starts in her right flank and radiates to her abdomen.  Has a history of UTI, no history of kidney stone.  Has tried Aleve and Tylenol with mild relief of her symptoms.  Notes that the Zofran that was given to her here has alleviated some of her symptoms.  Has associated nausea. Denies fever, urinary symptoms, diarrhea, constipation, vomiting.    The history is provided by the patient. No language interpreter was used.       Home Medications Prior to Admission medications   Medication Sig Start Date End Date Taking? Authorizing Provider  cephALEXin (KEFLEX) 500 MG capsule Take 1 capsule (500 mg total) by mouth 2 (two) times daily for 5 days. 01/26/22 01/31/22 Yes Judith Campillo A, PA-C  naproxen (NAPROSYN) 500 MG tablet Take 1 tablet (500 mg total) by mouth 2 (two) times daily. 01/26/22  Yes Adi Seales A, PA-C  ondansetron (ZOFRAN) 4 MG tablet Take 1 tablet (4 mg total) by mouth every 6 (six) hours. 01/26/22  Yes Efren Kross A, PA-C  phenazopyridine (PYRIDIUM) 95 MG tablet Take 1 tablet (95 mg total) by mouth 3 (three) times daily as needed for up to 2 days for York. 01/26/22 01/28/22 Yes Thaddeaus Monica A, PA-C  amLODipine (NORVASC) 10 MG tablet Take 1 tablet by mouth daily. 02/06/18   [provider]  cloNIDine (CATAPRES) 0.1 MG tablet Take one pill per day if SBP over 160 Patient not taking: Reported on 02/16/2021 01/27/17   Jacalyn Lefevre, MD  DULoxetine (CYMBALTA) 30 MG capsule Take 30 mg by mouth 2 (two) times daily.    [provider]  gabapentin (NEURONTIN) 100 MG capsule Take 100-200 mg by mouth 2 (two) times daily. 100mg  in the morning and 200mg  at  night.    [provider]  losartan (COZAAR) 100 MG tablet Take 1 tablet by mouth daily. 03/29/17   [provider]  metFORMIN (GLUCOPHAGE) 1000 MG tablet Take 1 tablet by mouth 2 (two) times daily.    [provider]  metoprolol succinate (TOPROL-XL) 100 MG 24 hr tablet Take 100 mg by mouth 2 (two) times daily. 02/06/18   [provider]  pantoprazole (PROTONIX) 40 MG tablet Take 1 tablet (40 mg total) by mouth daily. Patient not taking: Reported on 02/16/2021 07/12/19   02/18/2021, PA-C  spironolactone (ALDACTONE) 25 MG tablet Take 1 tablet by mouth daily.    [provider]      Allergies    Sulfa antibiotics    Review of Systems   Review of Systems  Constitutional:  Negative for fever.  Gastrointestinal:  Positive for [name] York and nausea. Negative for constipation, diarrhea and vomiting.  Genitourinary:  Negative for dysuria and hematuria.  All other systems reviewed and are negative.   Physical Exam Updated Vital Signs BP 135/78   Pulse 81   Temp 98 F (36.7 C) (Oral)   Resp 16   Ht 5\' 7"  (1.702 m)   Wt 127 kg   LMP 12/19/2021   SpO2 98%   BMI 43.85 kg/m  Physical Exam Vitals and nursing note reviewed.  Constitutional:      General: She  is not in acute distress.    Appearance: She is not diaphoretic.  HENT:     Head: Normocephalic and atraumatic.     Mouth/Throat:     Pharynx: No oropharyngeal exudate.  Eyes:     General: No scleral icterus.    Conjunctiva/sclera: Conjunctivae normal.  Cardiovascular:     Rate and Rhythm: Normal rate and regular rhythm.     Pulses: Normal pulses.     Heart sounds: Normal heart sounds.  Pulmonary:     Effort: Pulmonary effort is normal. No respiratory distress.     Breath sounds: Normal breath sounds. No wheezing.  [name]:     General: Bowel sounds are normal.     Palpations: Abdomen is soft. There is no mass.     Tenderness: There is [name] tenderness in the right  upper quadrant and right lower quadrant. There is right CVA tenderness. There is no guarding or rebound.     Comments: Right CVA tenderness to palpation.  Tenderness to palpation noted to right sided abdomen.  Musculoskeletal:        General: Normal range of motion.     Cervical back: Normal range of motion and neck supple.  Skin:    General: Skin is warm and dry.  Neurological:     Mental Status: She is alert.  Psychiatric:        Behavior: Behavior normal.     ED Results / Procedures / Treatments   Labs (all labs ordered are listed, but only abnormal results are displayed) Labs Reviewed  COMPREHENSIVE METABOLIC PANEL - Abnormal; Notable for the following components:      Result Value   Glucose, Bld 185 (*)    All other components within normal limits  CBC - Abnormal; Notable for the following components:   WBC 12.6 (*)    Platelets 427 (*)    All other components within normal limits  URINALYSIS, ROUTINE W REFLEX MICROSCOPIC - Abnormal; Notable for the following components:   APPearance CLOUDY (*)    Glucose, UA >=500 (*)    Hgb urine dipstick LARGE (*)    Protein, ur >=300 (*)    Nitrite POSITIVE (*)    Leukocytes,Ua LARGE (*)    RBC / HPF >50 (*)    WBC, UA >50 (*)    Bacteria, UA RARE (*)    Non Squamous Epithelial 0-5 (*)    All other components within normal limits  CBG MONITORING, ED - Abnormal; Notable for the following components:   Glucose-Capillary 178 (*)    All other components within normal limits  URINE CULTURE  LIPASE, BLOOD  PREGNANCY, URINE    EKG None  Radiology CT ABDOMEN PELVIS W CONTRAST  Result Date: 01/26/2022 CLINICAL DATA:  Right flank/right lower quadrant [name] York with diaphoresis and chills. EXAM: CT ABDOMEN AND PELVIS WITH CONTRAST TECHNIQUE: Multidetector CT imaging of the abdomen and pelvis was performed using the standard protocol following bolus administration of intravenous contrast. RADIATION DOSE REDUCTION: This exam was  performed according to the departmental dose-optimization program which includes automated exposure control, adjustment of the mA and/or kV according to patient size and/or use of iterative reconstruction technique. CONTRAST:  100mL OMNIPAQUE IOHEXOL 300 MG/ML  SOLN COMPARISON:  CT abdomen and pelvis 07/12/2019 FINDINGS: Lower chest: Clear lung bases. Hepatobiliary: No focal liver abnormality is seen. No gallstones, gallbladder wall thickening, or biliary dilatation. Pancreas: Unremarkable. Spleen: Unremarkable. Adrenals/Urinary Tract: Mild chronic thickening of both adrenal glands. No evidence of a renal  mass, calculi, or hydronephrosis. Unremarkable bladder. Stomach/Bowel: The stomach is unremarkable. There is no evidence of bowel obstruction or inflammation. The appendix is unremarkable. Vascular/Lymphatic: Normal caliber of the [name] aorta. No enlarged lymph nodes. Reproductive: Known uterine fibroids including a 3.5-4 cm fibroid on the left, similar to the prior CT. Unremarkable ovaries. Other: No ascites or pneumoperitoneum. Musculoskeletal: No acute osseous abnormality or suspicious osseous lesion. Severe facet arthrosis at L4-5 with trace anterolisthesis and moderate to severe left neural foraminal stenosis. IMPRESSION: 1. No acute abnormality identified in the abdomen or pelvis. 2. Uterine fibroids. Electronically Signed   By: Sebastian Ache M.D.   On: 01/26/2022 11:39    Procedures Procedures    Medications Ordered in ED Medications  ondansetron (ZOFRAN-ODT) disintegrating tablet 4 mg (4 mg Oral Given 01/26/22 0909)  ketorolac (TORADOL) 15 MG/ML injection 15 mg (15 mg Intravenous Given 01/26/22 1021)  iohexol (OMNIPAQUE) 300 MG/ML solution 100 mL (100 mLs Intravenous Contrast Given 01/26/22 1111)  HYDROcodone-acetaminophen (NORCO/VICODIN) 5-325 MG per tablet 1 tablet (1 tablet Oral Given 01/26/22 1209)    ED Course/ Medical Decision Making/ A&P Clinical Course as of 01/26/22 1255  Wed Jan 26, 2022  1024 Pt re-evaluated and noted improvement of symptoms with treatment regimen in the ED.  Discussed with patient and daughter regarding treatment regimen.  Answered all available questions.  Patient appears safe for discharge at this time. [SB]    Clinical Course User Index [SB] Andrell Bergeson A, PA-C                           Medical Decision Making Amount and/or Complexity of Data Reviewed Labs: ordered. Radiology: ordered.  Risk OTC drugs. Prescription drug management.   Patient presents to the emergency department with right sided [name] York and flank York.  Has associated nausea.  Denies urinary symptoms.  Patient afebrile, not tachycardic or hypoxic.  On exam patient with right CVA tenderness to palpation.  Tenderness to palpation noted to right sided abdomen.  No acute cardiovascular respiratory exam findings.  Differential diagnosis includes cholecystitis, appendicitis, acute cystitis, pyelonephritis, nephrolithiasis.   Labs:  I ordered, and personally interpreted labs.  The pertinent results include:  Urinalysis notable for acute cystitis.  Urine culture sent. Negative pregnancy urine. CMP with elevated glucose at 185 otherwise unremarkable. Lipase negative. CBC with leukocytosis of 12.6 otherwise unremarkable.   Imaging: I ordered imaging studies including CT abdomen pelvis with I independently visualized and interpreted imaging which showed  1. No acute abnormality identified in the abdomen or pelvis.  2. Uterine fibroids.   I agree with the radiologist interpretation  Medications:  I ordered medication including Toradol, Zofran, Norco for symptom management.  Reevaluation of the patient after these medicines and interventions, I reevaluated the patient and found that they have improved I have reviewed the patients home medicines and have made adjustments as neede   Disposition: Presentation concerning for acute cystitis with hematuria.  Doubt at  this time concerns for pancreatitis, appendicitis, nephrolithiasis, pyelonephritis, cholecystitis. After consideration of the diagnostic results and the patients response to treatment, I feel that the patient would benefit from Discharge home.  Patient will be discharged home with a prescription for Naprosyn, Keflex, Zofran, Pyridium.  Instructed patient to follow-up with primary care provider regarding today's ED visit.  Supportive care measures and strict return precautions discussed with patient at bedside. Pt acknowledges and verbalizes understanding. Pt appears safe for discharge. Follow up as indicated  in discharge paperwork.   This chart was dictated using voice recognition software, Dragon. Despite the best efforts of this provider to proofread and correct errors, errors may still occur which can change documentation meaning.  Final Clinical Impression(s) / ED Diagnoses Final diagnoses:  Acute cystitis with hematuria    Rx / DC Orders ED Discharge Orders          Ordered    naproxen (NAPROSYN) 500 MG tablet  2 times daily        01/26/22 1244    cephALEXin (KEFLEX) 500 MG capsule  2 times daily        01/26/22 1244    ondansetron (ZOFRAN) 4 MG tablet  Every 6 hours        01/26/22 1244    phenazopyridine (PYRIDIUM) 95 MG tablet  3 times daily PRN        01/26/22 1244              Artemis Loyal A, PA-C 01/26/22 1255    Glyn Ade, MD 01/29/22 1459

## 2022-01-26 NOTE — ED Notes (Signed)
See triage notes. Denies v/d but c/o nausea. Pt holding emesis bag during assessment

## 2022-01-28 LAB — URINE CULTURE: Culture: >=100000 — AB

## 2022-01-29 ENCOUNTER — Emergency Department (HOSPITAL_COMMUNITY): Payer: No Typology Code available for payment source

## 2022-01-29 ENCOUNTER — Telehealth (HOSPITAL_BASED_OUTPATIENT_CLINIC_OR_DEPARTMENT_OTHER): Payer: Self-pay | Admitting: *Deleted

## 2022-01-29 ENCOUNTER — Emergency Department (HOSPITAL_COMMUNITY)
Admission: EM | Admit: 2022-01-29 | Discharge: 2022-01-29 | Disposition: A | Discharge status: Home / Self Care | Payer: No Typology Code available for payment source | Attending: Emergency Medicine | Admitting: Emergency Medicine

## 2022-01-29 ENCOUNTER — Encounter (HOSPITAL_COMMUNITY): Payer: Self-pay | Admitting: Emergency Medicine

## 2022-01-29 ENCOUNTER — Other Ambulatory Visit: Payer: Self-pay

## 2022-01-29 DIAGNOSIS — R109 Unspecified abdominal pain: Secondary | ICD-10-CM | POA: Diagnosis not present

## 2022-01-29 DIAGNOSIS — M545 Low back pain, unspecified: Secondary | ICD-10-CM | POA: Diagnosis present

## 2022-01-29 DIAGNOSIS — M549 Dorsalgia, unspecified: Secondary | ICD-10-CM

## 2022-01-29 DIAGNOSIS — N390 Urinary tract infection, site not specified: Secondary | ICD-10-CM | POA: Insufficient documentation

## 2022-01-29 LAB — CBC WITH DIFFERENTIAL/PLATELET
Abs Immature Granulocytes: 0.02 10*3/uL (ref 0.00–0.07)
Basophils Absolute: 0.1 10*3/uL (ref 0.0–0.1)
Basophils Relative: 1 %
Eosinophils Absolute: 0.1 10*3/uL (ref 0.0–0.5)
Eosinophils Relative: 1 %
HCT: 40 % (ref 36.0–46.0)
Hemoglobin: 13.4 g/dL (ref 12.0–15.0)
Immature Granulocytes: 0 %
Lymphocytes Relative: 35 %
Lymphs Abs: 2.3 10*3/uL (ref 0.7–4.0)
MCH: 30.8 pg (ref 26.0–34.0)
MCHC: 33.5 g/dL (ref 30.0–36.0)
MCV: 92 fL (ref 80.0–100.0)
Monocytes Absolute: 0.7 10*3/uL (ref 0.1–1.0)
Monocytes Relative: 11 %
Neutro Abs: 3.5 10*3/uL (ref 1.7–7.7)
Neutrophils Relative %: 52 %
Platelets: 427 10*3/uL — ABNORMAL HIGH (ref 150–400)
RBC: 4.35 MIL/uL (ref 3.87–5.11)
RDW: 12.1 % (ref 11.5–15.5)
WBC: 6.7 10*3/uL (ref 4.0–10.5)
nRBC: 0 % (ref 0.0–0.2)

## 2022-01-29 LAB — COMPREHENSIVE METABOLIC PANEL
ALT: 24 U/L (ref 0–44)
AST: 21 U/L (ref 15–41)
Albumin: 3.7 g/dL (ref 3.5–5.0)
Alkaline Phosphatase: 66 U/L (ref 38–126)
Anion gap: 9 (ref 5–15)
BUN: 12 mg/dL (ref 6–20)
CO2: 27 mmol/L (ref 22–32)
Calcium: 9.4 mg/dL (ref 8.9–10.3)
Chloride: 101 mmol/L (ref 98–111)
Creatinine, Ser: 0.78 mg/dL (ref 0.44–1.00)
GFR, Estimated: >60 mL/min (ref >60)
Glucose, Bld: 236 mg/dL — ABNORMAL HIGH (ref 70–99)
Potassium: 4.2 mmol/L (ref 3.5–5.1)
Sodium: 137 mmol/L (ref 135–145)
Total Bilirubin: 0.1 mg/dL — ABNORMAL LOW (ref 0.3–1.2)
Total Protein: 8 g/dL (ref 6.5–8.1)

## 2022-01-29 LAB — URINALYSIS, ROUTINE W REFLEX MICROSCOPIC
Bacteria, UA: NONE SEEN
Bilirubin Urine: NEGATIVE
Glucose, UA: >=500 mg/dL — AB
Hgb urine dipstick: NEGATIVE
Ketones, ur: NEGATIVE mg/dL
Nitrite: NEGATIVE
Protein, ur: NEGATIVE mg/dL
Specific Gravity, Urine: 1.022 (ref 1.005–1.030)
pH: 5 (ref 5.0–8.0)

## 2022-01-29 LAB — POC URINE PREG, ED: Preg Test, Ur: NEGATIVE

## 2022-01-29 LAB — LIPASE, BLOOD: Lipase: 33 U/L (ref 11–51)

## 2022-01-29 MED ORDER — HYDROCODONE-ACETAMINOPHEN 5-325 MG PO TABS
1.0000 | ORAL_TABLET | Freq: Once | ORAL | Status: AC
  Administered 2022-01-29: 1 via ORAL
  Filled 2022-01-29: qty 1

## 2022-01-29 MED ORDER — KETOROLAC TROMETHAMINE 15 MG/ML IJ SOLN
15.0000 mg | Freq: Once | INTRAMUSCULAR | Status: AC
  Administered 2022-01-29: 15 mg via INTRAVENOUS
  Filled 2022-01-29: qty 1

## 2022-01-29 MED ORDER — HYDROCODONE-ACETAMINOPHEN 5-325 MG PO TABS: 1.0000 | ORAL_TABLET | ORAL | 0 refills | Status: AC | PRN

## 2022-01-29 MED ORDER — CEPHALEXIN 500 MG PO CAPS: 500.0000 mg | ORAL_CAPSULE | Freq: Four times a day (QID) | ORAL | 0 refills | Status: AC

## 2022-01-29 MED ORDER — IOHEXOL 300 MG/ML  SOLN
100.0000 mL | Freq: Once | INTRAMUSCULAR | Status: AC | PRN
  Administered 2022-01-29: 100 mL via INTRAVENOUS

## 2022-01-29 MED ORDER — SODIUM CHLORIDE 0.9 % IV SOLN
2.0000 g | Freq: Once | INTRAVENOUS | Status: AC
  Administered 2022-01-29: 2 g via INTRAVENOUS
  Filled 2022-01-29: qty 20

## 2022-01-29 NOTE — Telephone Encounter (Signed)
Post ED Visit - Positive Culture Follow-up  Culture report reviewed by antimicrobial stewardship pharmacist: [facility] []  , Pharm.D. []  Enzo Bi, Pharm.D., BCPS AQ-ID []  , Pharm.D., BCPS []  Celedonio Miyamoto, Pharm.D., BCPS []  [place], Garvin Fila.D., BCPS, AAHIVP []  , Pharm.D., BCPS, AAHIVP []  Georgina Pillion, PharmD, BCPS []  , PharmD, BCPS []  [place], PharmD, BCPS []  [place], PharmD []  , PharmD, BCPS [x]  Estella Husk, PharmD  Pharmacy Team []  Lysle Pearl, PharmD []  , PharmD []  Phillips Climes, PharmD []  , Rph []  Agapito Games) , PharmD []  Verlan Friends, PharmD []  , PharmD []  Mervyn Gay, PharmD []  , PharmD []  Estill Batten, PharmD []  Wonda [facility], PharmD []  , PharmD []  Len Childs, PharmD   Positive urine culture Treated with Cephalexin, organism sensitive to the same and no further patient follow-up is required at this time.  01/29/2022, 11:30 AM

## 2022-01-29 NOTE — ED Provider Notes (Cosign Needed)
[facility] EMERGENCY DEPARTMENT Provider Note   CSN: [id number] Arrival date & time: 01/29/22  0940     History  Chief Complaint  Patient presents with   Abdominal Pain   Back Pain    [name] York is a 51 y.o. female.  Patient complains of continued abdominal pain and low back pain.  Patient was treated here 3 days ago for a urinary tract infection.  Patient had a urine culture which grew E. coli.  Patient reports she has been taking the antibiotic naproxen and Zofran without relief.  Patient denies any fever or chills patient has not had any nausea or vomiting.  Patient receives her primary care at the [place].  The history is provided by the patient. No language interpreter was used.  Abdominal Pain Pain location:  Suprapubic Pain quality: aching   Pain radiates to:  Does not radiate Pain severity:  Moderate Onset quality:  Gradual Timing:  Constant Progression:  Worsening Relieved by:  Nothing Worsened by:  Nothing Associated symptoms: nausea   Back Pain Associated symptoms: abdominal pain        Home Medications Prior to Admission medications   Medication Sig Start Date End Date Taking? Authorizing Provider  amLODipine (NORVASC) 10 MG tablet Take 1 tablet by mouth daily. 02/06/18   [provider]  cephALEXin (KEFLEX) 500 MG capsule Take 1 capsule (500 mg total) by mouth 2 (two) times daily for 5 days. 01/26/22 01/31/22  Blue, Soijett A, PA-C  cloNIDine (CATAPRES) 0.1 MG tablet Take one pill per day if SBP over 160 Patient not taking: Reported on 02/16/2021 01/27/17   Isla Pence, MD  DULoxetine (CYMBALTA) 30 MG capsule Take 30 mg by mouth 2 (two) times daily.    [provider]  gabapentin (NEURONTIN) 100 MG capsule Take 100-200 mg by mouth 2 (two) times daily. 100mg  in the morning and 200mg  at night.    [provider]  losartan (COZAAR) 100 MG tablet Take 1 tablet by mouth daily. 03/29/17   [provider]  metFORMIN  (GLUCOPHAGE) 1000 MG tablet Take 1 tablet by mouth 2 (two) times daily.    [provider]  metoprolol succinate (TOPROL-XL) 100 MG 24 hr tablet Take 100 mg by mouth 2 (two) times daily. 02/06/18   [provider]  naproxen (NAPROSYN) 500 MG tablet Take 1 tablet (500 mg total) by mouth 2 (two) times daily. 01/26/22   Blue, Soijett A, PA-C  ondansetron (ZOFRAN) 4 MG tablet Take 1 tablet (4 mg total) by mouth every 6 (six) hours. 01/26/22   Blue, Soijett A, PA-C  pantoprazole (PROTONIX) 40 MG tablet Take 1 tablet (40 mg total) by mouth daily. Patient not taking: Reported on 02/16/2021 07/12/19   Kem Parkinson, PA-C  spironolactone (ALDACTONE) 25 MG tablet Take 1 tablet by mouth daily.    [provider]      Allergies    Sulfa antibiotics    Review of Systems   Review of Systems  Gastrointestinal:  Positive for abdominal pain and nausea.  Musculoskeletal:  Positive for back pain.  All other systems reviewed and are negative.   Physical Exam Updated Vital Signs BP 137/83 (BP Location: Right Arm)   Pulse 70   Temp 98.7 F (37.1 C)   Resp 19   Ht 5\' 7"  (1.702 m)   Wt 127 kg   LMP 12/19/2021 (Exact Date)   SpO2 98%   BMI 43.85 kg/m  Physical Exam Vitals and nursing note reviewed.  Constitutional:      Appearance: She is well-developed.  HENT:     Head: Normocephalic.  Cardiovascular:     Rate and Rhythm: Normal rate and regular rhythm.  Pulmonary:     Effort: Pulmonary effort is normal.  Abdominal:     General: Abdomen is flat. Bowel sounds are normal. There is no distension.     Palpations: Abdomen is soft.     Tenderness: There is generalized abdominal tenderness and tenderness in the suprapubic area.  Musculoskeletal:        General: Normal range of motion.     Cervical back: Normal range of motion.  Neurological:     Mental Status: She is alert and oriented to person, place, and time.     ED Results / Procedures / Treatments   Labs (all  labs ordered are listed, but only abnormal results are displayed) Labs Reviewed  URINALYSIS, ROUTINE W REFLEX MICROSCOPIC - Abnormal; Notable for the following components:      Result Value   APPearance HAZY (*)    Glucose, UA >=500 (*)    Leukocytes,Ua SMALL (*)    All other components within normal limits  CBC WITH DIFFERENTIAL/PLATELET - Abnormal; Notable for the following components:   Platelets 427 (*)    All other components within normal limits  COMPREHENSIVE METABOLIC PANEL - Abnormal; Notable for the following components:   Glucose, Bld 236 (*)    Total Bilirubin 0.1 (*)    All other components within normal limits  LIPASE, BLOOD  POC URINE PREG, ED    EKG None  Radiology CT Abdomen Pelvis W Contrast  Result Date: 01/29/2022 CLINICAL DATA:  Right lower quadrant abdominal pain EXAM: CT ABDOMEN AND PELVIS WITH CONTRAST TECHNIQUE: Multidetector CT imaging of the abdomen and pelvis was performed using the standard protocol following bolus administration of intravenous contrast. RADIATION DOSE REDUCTION: This exam was performed according to the departmental dose-optimization program which includes automated exposure control, adjustment of the mA and/or kV according to patient size and/or use of iterative reconstruction technique. CONTRAST:  158mL OMNIPAQUE IOHEXOL 300 MG/ML  SOLN COMPARISON:  CT January 26, 2022. FINDINGS: Lower chest: No acute abnormality. Hepatobiliary: Probable hepatic steatosis. Gallbladder is nondistended. No biliary ductal dilation. Pancreas: No pancreatic ductal dilation or evidence of acute inflammation. Spleen: No splenomegaly. Adrenals/Urinary Tract: Similar thickening of the bilateral adrenal glands without discrete nodularity favor hyperplasia. No hydronephrosis. Nonobstructive punctate bilateral renal stones. Kidneys demonstrate symmetric enhancement and excretion of contrast material. Urinary bladder is unremarkable for degree of distension.  Stomach/Bowel: Stomach is unremarkable for degree of distension. No pathologic dilation of small or large bowel. The appendix appears normal. No evidence of acute bowel inflammation. Vascular/Lymphatic: Normal caliber abdominal aorta. No pathologically enlarged abdominal or pelvic lymph nodes. Reproductive: Enlarged leiomyomatous uterus. No suspicious adnexal mass. Other: No significant abdominopelvic free fluid. Musculoskeletal: Multilevel degenerative changes spine with grade 1 L4 on L5 degenerative anterolisthesis. No acute osseous abnormality. IMPRESSION: 1. No acute abnormality in the abdomen or pelvis. Normal appendix. 2. Nonobstructive punctate bilateral renal stones. 3. Enlarged leiomyomatous uterus. 4. Probable hepatic steatosis. Electronically Signed   By: Dahlia Bailiff M.D.   On: 01/29/2022 14:25    Procedures Procedures    Medications Ordered in ED Medications  HYDROcodone-acetaminophen (NORCO/VICODIN) 5-325 MG per tablet 1 tablet (has no administration in time range)  iohexol (OMNIPAQUE) 300 MG/ML solution 100 mL (100 mLs Intravenous Contrast Given 01/29/22 1356)  cefTRIAXone (ROCEPHIN) 2 g in sodium chloride 0.9 %  100 mL IVPB (0 g Intravenous Stopped 01/29/22 1515)  ketorolac (TORADOL) 15 MG/ML injection 15 mg (15 mg Intravenous Given 01/29/22 1453)    ED Course/ Medical Decision Making/ A&P                           Medical Decision Making Patient complains of continued abdominal and back pain.  Patient was diagnosed with a UTI 3 days ago.  Amount and/or Complexity of Data Reviewed External Data Reviewed: notes.    Details: Previous notes labs and CT from her emergency department visit were reviewed Labs: ordered. Decision-making details documented in ED Course.    Details: Labs ordered reviewed and interpreted.  Patient is a glucose elevated to 236.  UA shows 11-20 white blood cells Radiology: ordered and independent interpretation performed. Decision-making details  documented in ED Course.    Details: CT scan shows no acute finding patient does have a uterine fibroid  Risk Prescription drug management. Risk Details: Patient given IV Rocephin 2 g and Toradol 15 mg IV.  Patient is given 1 dose of hydrocodone for persistent pain.  Patient is advised to continue oral antibiotics.  Patient is given a prescription for 10 hydrocodone for pain.  She is advised to follow-up with her primary care physician for recheck.  Labs ordered reviewed and interpreted         Final Clinical Impression(s) / ED Diagnoses Final diagnoses:  Urinary tract infection without hematuria, site unspecified  Back pain, unspecified back location, unspecified back pain laterality, unspecified chronicity    Rx / DC Orders ED Discharge Orders          Ordered    cephALEXin (KEFLEX) 500 MG capsule  4 times daily        01/29/22 1603    HYDROcodone-acetaminophen (NORCO/VICODIN) 5-325 MG tablet  Every 4 hours PRN        01/29/22 1603          An After Visit Summary was printed and given to the patient.     Elson Areas, [place] 01/29/22 3244

## 2022-01-29 NOTE — Discharge Instructions (Addendum)
Increase antibiotic to 4 times a day.  Follow up with your Physician for recheck

## 2022-01-29 NOTE — ED Triage Notes (Signed)
Pt c/o lower abd pain and back pain x 4 days. Was dx with UTI 3 days ago and placed on antibiotics with no relief of pain.

## 2022-01-29 NOTE — ED Provider Triage Note (Signed)
Emergency Medicine Provider Triage Evaluation Note  [name] York , a 51 y.o. female  was evaluated in triage.  Pt complains of abdominal pain and back pain.  Symptoms have been going on for couple months.  Have progressively worsened in the last couple weeks.  Was found to have UTI on December 27 when she was evaluated for these complaints.  Started on Keflex and her urine has cleared and she no longer has painful urination.  At this time still endorsing right lower quadrant pain in her abdomen.  Back pain is in the upper mid back.  Denies flank pain.  Denies fever nausea vomiting diarrhea at home.  Review of Systems  Positive: See above Negative: See above  Physical Exam  BP 116/77   Pulse 74   Temp 98.7 F (37.1 C)   Resp 18   Ht 5\' 7"  (1.702 m)   Wt 127 kg   LMP 12/19/2021 (Exact Date)   SpO2 99%   BMI 43.85 kg/m  Gen:   Awake, no distress   Resp:  Normal effort  MSK:   Moves extremities without difficulty  Other:  Rlq tenderness  Medical Decision Making  Medically screening exam initiated at 12:04 PM.  Appropriate orders placed.  Wyatt L Anand was informed that the remainder of the evaluation will be completed by another provider, this initial triage assessment does not replace that evaluation, and the importance of remaining in the ED until their evaluation is complete.  Work up started   12/21/2021, PA-C 01/29/22 1205

## 2023-07-02 IMAGING — MR MR HEAD WO/W CM
12 of 14 series · 38 of 48 positions shown · IV contrast (gadavist)
Comparison: None.

CLINICAL DATA: Neuro deficit, acute, stroke suspected

EXAM:
MRI HEAD WITHOUT AND WITH CONTRAST
TECHNIQUE: Multiplanar, multiecho pulse sequences of the brain and surrounding
structures were obtained without and with intravenous contrast.
CONTRAST:  10mL GADAVIST GADOBUTROL 1 MMOL/ML IV SOLN

[Series 5: DWI · axial · 4.0mm · 0.92mm/px · z∈[-78,+55]mm · 4 of 36 slices shown (1 of 6)]
[im 1/36]
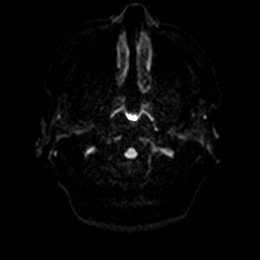
[im 12/36]
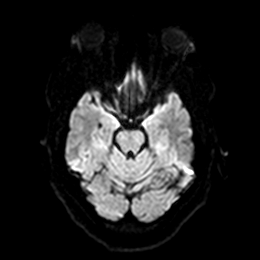
[im 24/36]
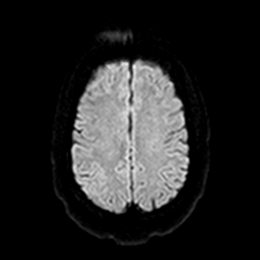
[im 36/36]
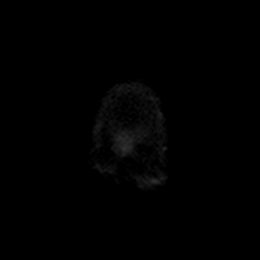

[Series 5: DWI · axial · 4.0mm · 0.92mm/px · z∈[-78,+55]mm · 4 of 36 slices shown (2 of 6)]
[im 1/36]
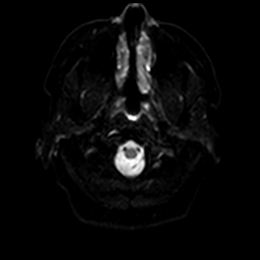
[im 12/36]
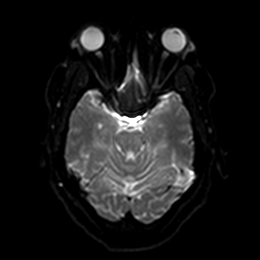
[im 24/36]
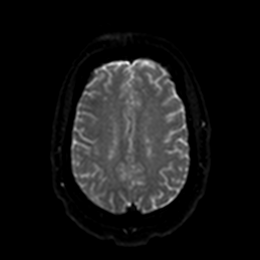
[im 36/36]
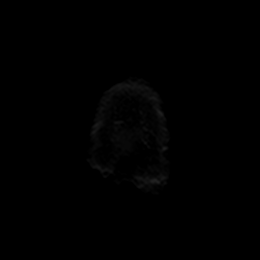

[Series 6: DWI · axial · 4.0mm · 0.92mm/px · z∈[-78,+55]mm · 4 of 36 slices shown (3 of 6)]
[im 1/36]
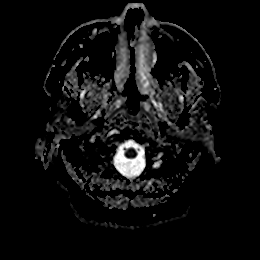
[im 12/36]
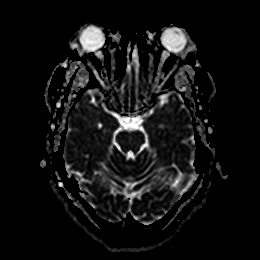
[im 24/36]
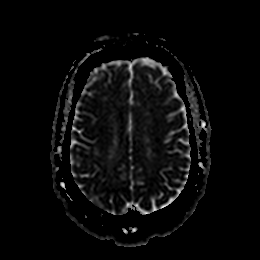
[im 36/36]
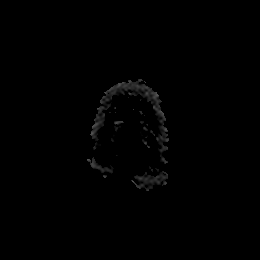

[Series 7: DWI · coronal · 5.0mm · 0.88mm/px · 3 of 30 slices shown (4 of 6)]
[im 1/30]
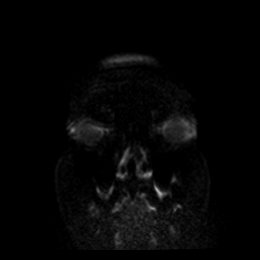
[im 15/30]
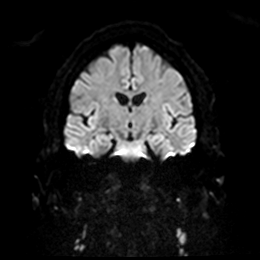
[im 30/30]
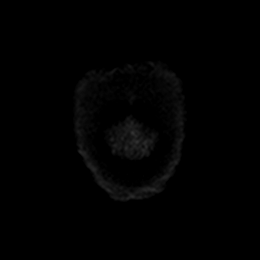

[Series 7: DWI · coronal · 5.0mm · 0.88mm/px · 3 of 30 slices shown (5 of 6)]
[im 1/30]
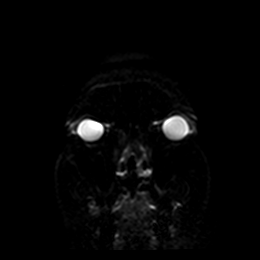
[im 15/30]
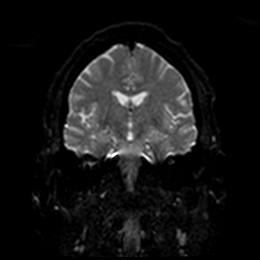
[im 30/30]
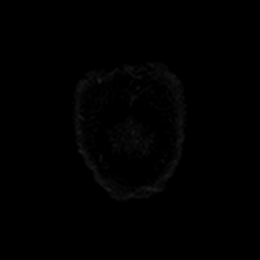

[Series 8: DWI · coronal · 5.0mm · 0.88mm/px · 3 of 30 slices shown (6 of 6)]
[im 1/30]
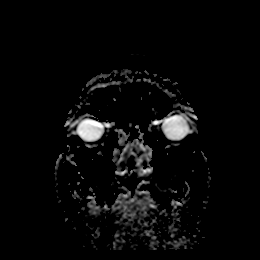
[im 15/30]
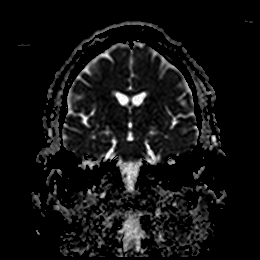
[im 30/30]
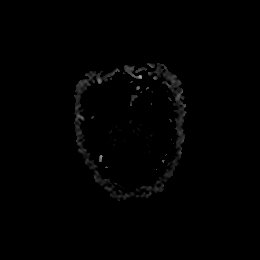

[Series 9: T1 · sagittal · 5.0mm · 0.94mm/px · 2 of 21 slices shown]
[im 1/21]
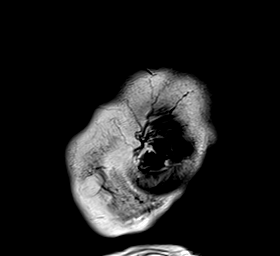
[im 21/21]
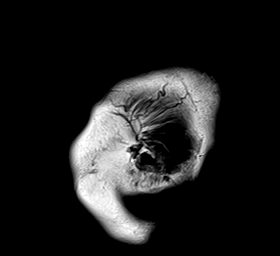

[Series 10: T2 · axial · 5.0mm · 0.75mm/px · z∈[-75,+52]mm · 2 of 20 slices shown (1 of 2)]
[im 1/20]
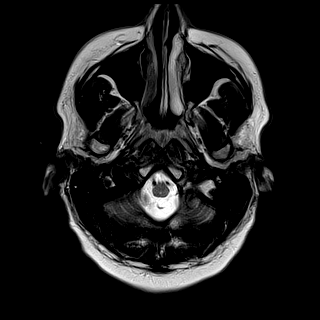
[im 20/20]
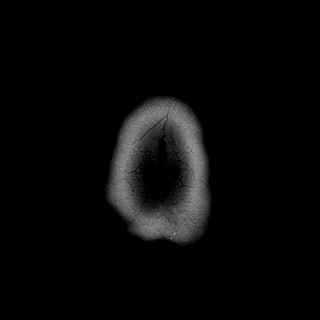

[Series 11: ax hemo · axial · 5.0mm · 0.94mm/px · z∈[-80,+57]mm · 3 of 25 slices shown]
[im 1/25]
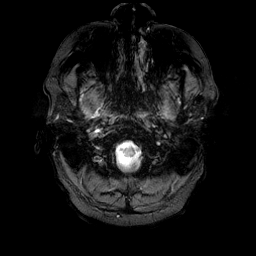
[im 13/25]
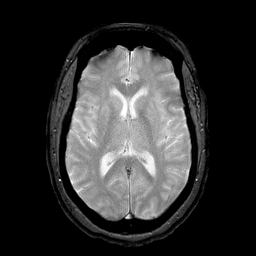
[im 25/25]
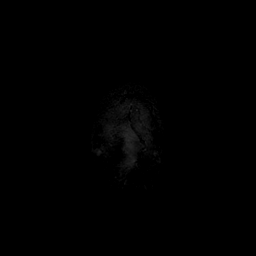

[Series 12: FLAIR · axial · 4.0mm · 0.47mm/px · z∈[-77,+53]mm · 4 of 35 slices shown]
[im 1/35]
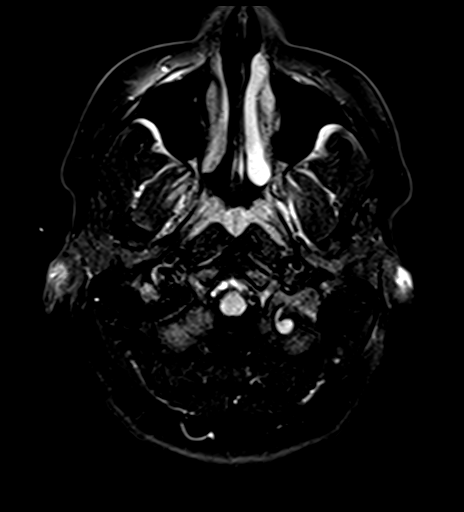
[im 12/35]
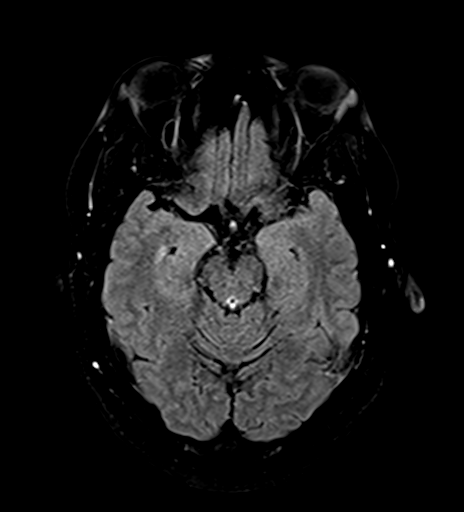
[im 23/35]
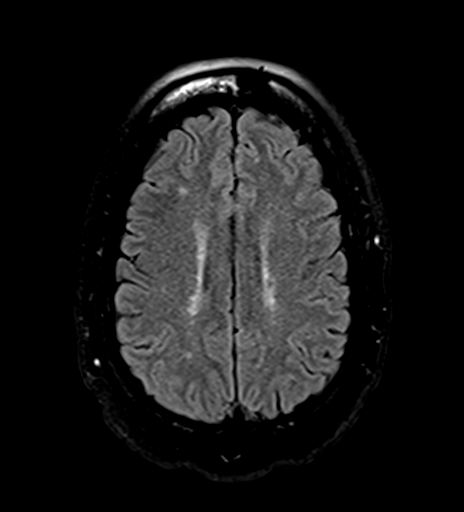
[im 35/35]
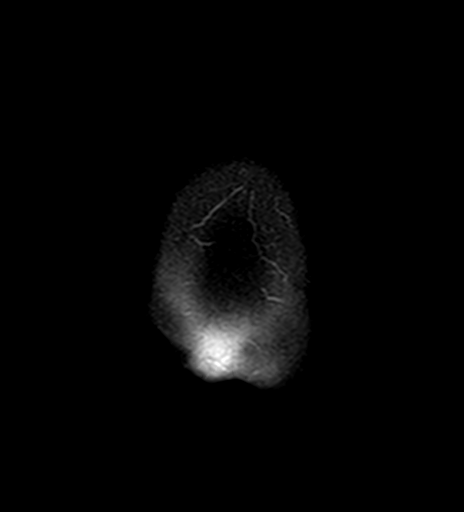

[Series 18: T2 · coronal · 5.0mm · 0.72mm/px · 3 of 28 slices shown (2 of 2)]
[im 1/28]
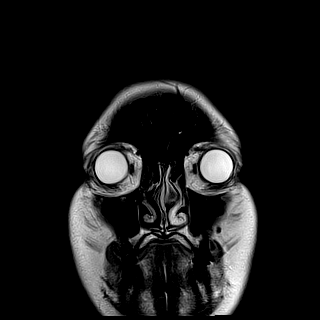
[im 14/28]
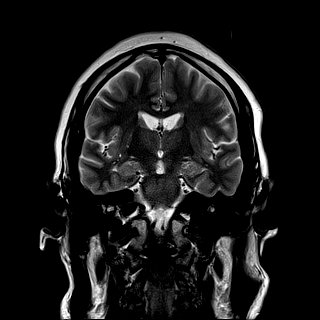
[im 28/28]
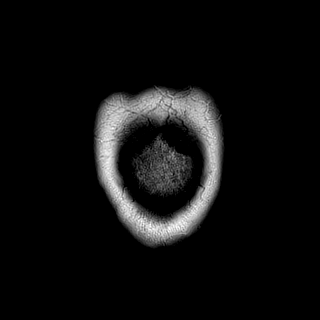

[Series 28: T1 post-contrast · coronal · 5.0mm · 0.34mm/px · 3 of 29 slices shown]
[im 1/29]
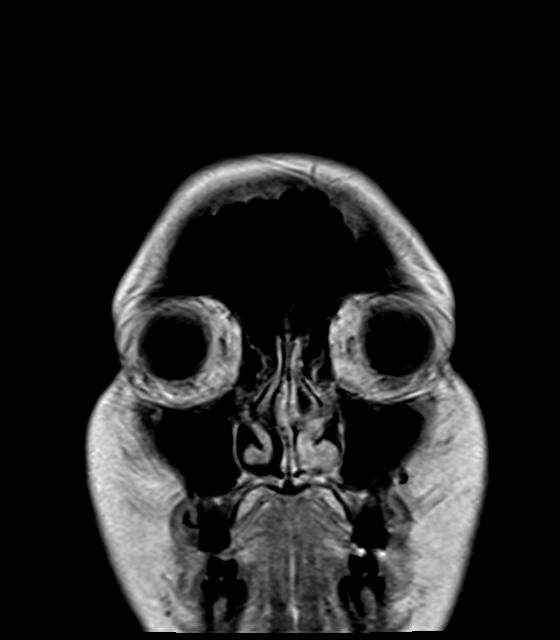
[im 15/29]
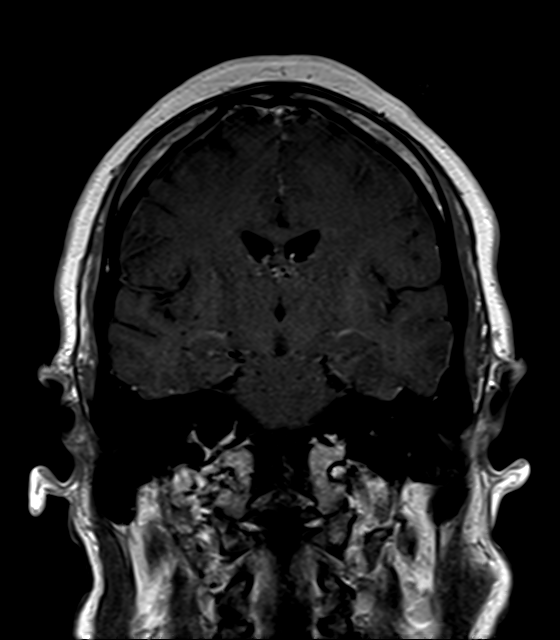
[im 29/29]
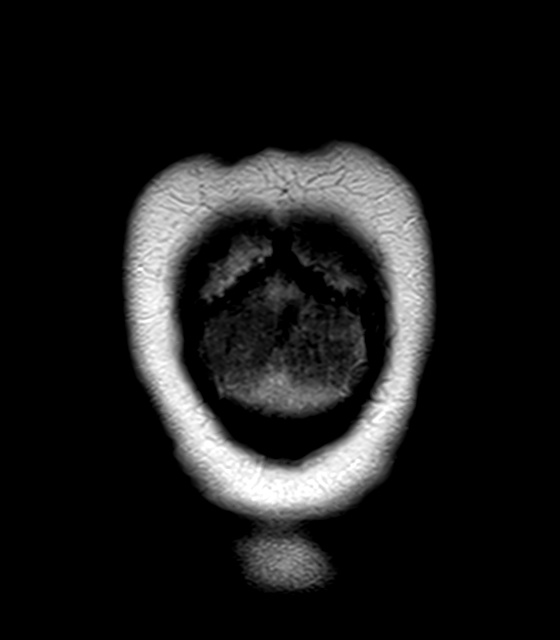

[38 of 48 positions shown; findings below may reference images not displayed]

FINDINGS: Brain: There is no acute infarction or intracranial hemorrhage.
There is no intracranial mass, mass effect, or edema. There is no
hydrocephalus or extra-axial fluid collection. Patchy foci of T2
hyperintensity in the supratentorial and pontine white matter
nonspecific but may reflect mild chronic microvascular ischemic
changes. Ventricles and sulci are normal in size and configuration.
Incidental small left cerebellar developmental venous anomaly.

Vascular: Major vessel flow voids at the skull base are preserved.

Skull and upper cervical spine: Normal marrow signal is preserved.

Sinuses/Orbits: Paranasal sinuses are aerated. Orbits are
unremarkable.

Other: Sella is unremarkable.  Mastoid air cells are clear.
IMPRESSION: No acute infarction, hemorrhage, or mass.

Mild chronic microvascular ischemic changes.

## 2023-11-21 ENCOUNTER — Encounter (HOSPITAL_COMMUNITY): Payer: Self-pay

## 2023-11-21 ENCOUNTER — Emergency Department (HOSPITAL_COMMUNITY)

## 2023-11-21 ENCOUNTER — Emergency Department (HOSPITAL_COMMUNITY)
Admission: EM | Admit: 2023-11-21 | Discharge: 2023-11-21 | Disposition: A | Discharge status: Home / Self Care | Attending: Emergency Medicine | Admitting: Emergency Medicine

## 2023-11-21 ENCOUNTER — Other Ambulatory Visit: Payer: Self-pay

## 2023-11-21 DIAGNOSIS — R10A2 Flank pain, left side: Secondary | ICD-10-CM | POA: Diagnosis present

## 2023-11-21 LAB — COMPREHENSIVE METABOLIC PANEL WITH GFR
ALT: 25 U/L (ref 0–44)
AST: 25 U/L (ref 15–41)
Albumin: 4.5 g/dL (ref 3.5–5.0)
Alkaline Phosphatase: 78 U/L (ref 38–126)
Anion gap: 13 (ref 5–15)
BUN: 20 mg/dL (ref 6–20)
CO2: 28 mmol/L (ref 22–32)
Calcium: 9.9 mg/dL (ref 8.9–10.3)
Chloride: 100 mmol/L (ref 98–111)
Creatinine, Ser: 1.12 mg/dL — ABNORMAL HIGH (ref 0.44–1.00)
GFR, Estimated: 59 mL/min — ABNORMAL LOW (ref >60)
Glucose, Bld: 107 mg/dL — ABNORMAL HIGH (ref 70–99)
Potassium: 4.2 mmol/L (ref 3.5–5.1)
Sodium: 140 mmol/L (ref 135–145)
Total Bilirubin: 0.3 mg/dL (ref 0.0–1.2)
Total Protein: 8.1 g/dL (ref 6.5–8.1)

## 2023-11-21 LAB — CBC
HCT: 39.7 % (ref 36.0–46.0)
Hemoglobin: 13.3 g/dL (ref 12.0–15.0)
MCH: 30.6 pg (ref 26.0–34.0)
MCHC: 33.5 g/dL (ref 30.0–36.0)
MCV: 91.3 fL (ref 80.0–100.0)
Platelets: 403 K/uL — ABNORMAL HIGH (ref 150–400)
RBC: 4.35 MIL/uL (ref 3.87–5.11)
RDW: 13.5 % (ref 11.5–15.5)
WBC: 7.8 K/uL (ref 4.0–10.5)
nRBC: 0 % (ref 0.0–0.2)

## 2023-11-21 LAB — URINALYSIS, ROUTINE W REFLEX MICROSCOPIC
Bilirubin Urine: NEGATIVE
Glucose, UA: 50 mg/dL — AB
Hgb urine dipstick: NEGATIVE
Ketones, ur: NEGATIVE mg/dL
Leukocytes,Ua: NEGATIVE
Nitrite: NEGATIVE
Protein, ur: 100 mg/dL — AB
Specific Gravity, Urine: 1.028 (ref 1.005–1.030)
pH: 5 (ref 5.0–8.0)

## 2023-11-21 LAB — LIPASE, BLOOD: Lipase: 38 U/L (ref 11–51)

## 2023-11-21 NOTE — Discharge Instructions (Signed)
 You were seen in the emergency department for left flank and left-sided abdominal pain.  You had blood work urinalysis and a CAT scan of your abdomen and pelvis that did not show an obvious cause of your symptoms.  Please continue Tylenol  and ibuprofen, increase fluid and fiber intake.  Follow-up with your team at the [facility].  Return to the emergency department if any high fevers or worsening symptoms.

## 2023-11-21 NOTE — ED Provider Notes (Signed)
 [place] EMERGENCY DEPARTMENT AT [facility] Provider Note   CSN: [id number] Arrival date & time: 11/21/23  1800     Patient presents with: Flank Pain   York York is a 53 y.o. female.  She is here with complaint of left flank into the left upper quadrant left mid abdomen pain has been going on for about a week.  No known trauma.  No urinary symptoms fevers chills or vomiting.  No vaginal bleeding or discharge.  She said she was a little nauseous.  She went to urgent care and they checked her urine and told her to take MiraLAX.  She did that over the weekend without improvement.  Went to the [facility] today and they said that it would take a few weeks for her to get a CAT scan so she came here.  {Add pertinent medical, surgical, social history, OB history to YEP:67052} The history is provided by the patient.  Flank Pain This is a new problem. The current episode started more than 1 week ago. The problem occurs constantly. The problem has not changed since onset.Associated symptoms include abdominal pain. Pertinent negatives include no chest pain, no headaches and no shortness of breath. Nothing aggravates the symptoms. Nothing relieves the symptoms. She has tried rest for the symptoms. The treatment provided no relief.       Prior to Admission medications   Medication Sig Start Date End Date Taking? Authorizing Provider  amLODipine  (NORVASC ) 10 MG tablet Take 1 tablet by mouth daily. 02/06/18   [provider]  cloNIDine  (CATAPRES ) 0.1 MG tablet Take one pill per day if SBP over 160 Patient not taking: Reported on 02/16/2021 01/27/17   Haviland, Julie, MD  DULoxetine (CYMBALTA) 30 MG capsule Take 30 mg by mouth 2 (two) times daily.    [provider]  gabapentin (NEURONTIN) 100 MG capsule Take 100-200 mg by mouth 2 (two) times daily. 100mg  in the morning and 200mg  at night.    [provider]  losartan  (COZAAR ) 100 MG tablet Take 1 tablet by mouth  daily. 03/29/17   [provider]  metFORMIN (GLUCOPHAGE) 1000 MG tablet Take 1 tablet by mouth 2 (two) times daily.    [provider]  metoprolol  succinate (TOPROL -XL) 100 MG 24 hr tablet Take 100 mg by mouth 2 (two) times daily. 02/06/18   [provider]  naproxen  (NAPROSYN ) 500 MG tablet Take 1 tablet (500 mg total) by mouth 2 (two) times daily. 01/26/22   Blue, Soijett A, PA-C  ondansetron  (ZOFRAN ) 4 MG tablet Take 1 tablet (4 mg total) by mouth every 6 (six) hours. 01/26/22   Blue, Soijett A, PA-C  pantoprazole  (PROTONIX ) 40 MG tablet Take 1 tablet (40 mg total) by mouth daily. Patient not taking: Reported on 02/16/2021 07/12/19   [name], [name], PA-C  polyethylene glycol (MIRALAX / GLYCOLAX) 17 g packet Take 17 g by mouth daily. 11/17/23   [provider]  pregabalin (LYRICA) 75 MG capsule Take 75 mg by mouth 3 (three) times daily. 06/20/23   [provider]  spironolactone (ALDACTONE) 25 MG tablet Take 1 tablet by mouth daily.    [provider]    Allergies: Bee venom, Amlodipine , Sulfa antibiotics, and Zonisamide    Review of Systems  Constitutional:  Negative for fever.  HENT:  Negative for sore throat.   Respiratory:  Negative for shortness of breath.   Cardiovascular:  Negative for chest pain.  Gastrointestinal:  Positive for abdominal pain and  nausea. Negative for constipation and vomiting.  Genitourinary:  Positive for flank pain. Negative for dysuria and vaginal bleeding.  Musculoskeletal:  Positive for back pain.  Skin:  Negative for rash.  Neurological:  Negative for headaches.    Updated Vital Signs BP (!) 175/101   Pulse 92   Temp 98.4 F (36.9 C) (Oral)   Resp 17   Ht 5' 7 (1.702 m)   Wt 127 kg   LMP 12/19/2021 (Exact Date)   SpO2 100%   BMI 43.85 kg/m   Physical Exam Vitals and nursing note reviewed.  Constitutional:      General: She is not in acute distress.    Appearance: Normal appearance. She is  well-developed.  HENT:     Head: Normocephalic and atraumatic.  Eyes:     Conjunctiva/sclera: Conjunctivae normal.  Cardiovascular:     Rate and Rhythm: Normal rate and regular rhythm.     Heart sounds: No murmur heard. Pulmonary:     Effort: Pulmonary effort is normal. No respiratory distress.     Breath sounds: Normal breath sounds. No stridor. No wheezing.  Abdominal:     Palpations: Abdomen is soft.     Tenderness: There is no abdominal tenderness. There is no guarding or rebound.  Musculoskeletal:        General: No tenderness or deformity. Normal range of motion.     Cervical back: Neck supple.  Skin:    General: Skin is warm and dry.  Neurological:     General: No focal deficit present.     Mental Status: She is alert.     GCS: GCS eye subscore is 4. GCS verbal subscore is 5. GCS motor subscore is 6.     Motor: No weakness.     Gait: Gait normal.     (all labs ordered are listed, but only abnormal results are displayed) Labs Reviewed  COMPREHENSIVE METABOLIC PANEL WITH GFR - Abnormal; Notable for the following components:      Result Value   Glucose, Bld 107 (*)    Creatinine, Ser 1.12 (*)    GFR, Estimated 59 (*)    All other components within normal limits  CBC - Abnormal; Notable for the following components:   Platelets 403 (*)    All other components within normal limits  LIPASE, BLOOD  URINALYSIS, ROUTINE W REFLEX MICROSCOPIC    EKG: None  Radiology: No results found.  {Document cardiac monitor, telemetry assessment procedure when appropriate:32947} Procedures   Medications Ordered in the ED - No data to display    {Click here for ABCD2, HEART and other calculators REFRESH Note before signing:1}                              Medical Decision Making Amount and/or Complexity of Data Reviewed Labs: ordered.   This patient complains of ***; this involves an extensive number of treatment Options and is a complaint that carries with it a high risk  of complications and morbidity. The differential includes ***  I ordered, reviewed and interpreted labs, which included *** I ordered medication *** and reviewed PMP when indicated. I ordered imaging studies which included *** and I independently    visualized and interpreted imaging which showed *** Additional history obtained from *** Previous records obtained and reviewed *** I consulted *** and discussed lab and imaging findings and discussed disposition.  Cardiac monitoring reviewed, *** Social determinants considered, *** Critical  Interventions: ***  After the interventions stated above, I reevaluated the patient and found *** Admission and further testing considered, ***   {Document critical care time when appropriate  Document review of labs and clinical decision tools ie CHADS2VASC2, etc  Document your independent review of radiology images and any outside records  Document your discussion with family members, caretakers and with consultants  Document social determinants of health affecting pt's care  Document your decision making why or why not admission, treatments were needed:32947:::1}   Final diagnoses:  None    ED Discharge Orders     None

## 2023-11-21 NOTE — ED Triage Notes (Signed)
 Pt reports Hx of a bad UTI. Pt denies hematuria or urinary frequency. Pt reports normal Bms today as well.

## 2023-11-21 NOTE — ED Triage Notes (Signed)
 Pt arrived via POV c/o left flank pain. Pt report seeing Urgent Care on Friday and also the [facility] in [place] today, but was only told that her large intestine looked enlarged.
